# Patient Record
Sex: Male | Born: 1955 | Race: White | Hispanic: No | Marital: Married | Smoking: Never smoker
Health system: Southern US, Community
[De-identification: ages and names within clinical notes are randomized; demographics above are authoritative.]

## PROBLEM LIST (undated history)

## (undated) DIAGNOSIS — G4733 Obstructive sleep apnea (adult) (pediatric): Principal | ICD-10-CM

## (undated) DIAGNOSIS — Z9989 Dependence on other enabling machines and devices: Principal | ICD-10-CM

## (undated) DIAGNOSIS — E785 Hyperlipidemia, unspecified: Secondary | ICD-10-CM

## (undated) DIAGNOSIS — J309 Allergic rhinitis, unspecified: Secondary | ICD-10-CM

## (undated) DIAGNOSIS — M1990 Unspecified osteoarthritis, unspecified site: Secondary | ICD-10-CM

## (undated) DIAGNOSIS — E7800 Pure hypercholesterolemia, unspecified: Secondary | ICD-10-CM

## (undated) DIAGNOSIS — I341 Nonrheumatic mitral (valve) prolapse: Secondary | ICD-10-CM

## (undated) DIAGNOSIS — G4700 Insomnia, unspecified: Principal | ICD-10-CM

## (undated) HISTORY — DX: Hyperlipidemia, unspecified: E78.5

## (undated) HISTORY — DX: Pure hypercholesterolemia, unspecified: E78.00

## (undated) HISTORY — DX: Insomnia, unspecified: G47.00

## (undated) HISTORY — DX: Obstructive sleep apnea (adult) (pediatric): G47.33

## (undated) HISTORY — DX: Unspecified osteoarthritis, unspecified site: M19.90

## (undated) HISTORY — PX: OTHER SURGICAL HISTORY: SHX169

## (undated) HISTORY — DX: Allergic rhinitis, unspecified: J30.9

## (undated) HISTORY — DX: Nonrheumatic mitral (valve) prolapse: I34.1

## (undated) HISTORY — DX: Dependence on other enabling machines and devices: Z99.89

---

## 1983-03-24 HISTORY — PX: TONSILLECTOMY AND ADENOIDECTOMY: SUR1326

## 2013-02-28 ENCOUNTER — Ambulatory Visit (INDEPENDENT_AMBULATORY_CARE_PROVIDER_SITE_OTHER): Payer: BC Managed Care – PPO | Admitting: Family Medicine

## 2013-02-28 ENCOUNTER — Encounter: Payer: Self-pay | Admitting: Family Medicine

## 2013-02-28 VITALS — BP 110/68 | HR 73 | Ht 73.0 in | Wt 203.0 lb

## 2013-02-28 DIAGNOSIS — M9981 Other biomechanical lesions of cervical region: Secondary | ICD-10-CM

## 2013-02-28 DIAGNOSIS — M999 Biomechanical lesion, unspecified: Secondary | ICD-10-CM

## 2013-02-28 DIAGNOSIS — M542 Cervicalgia: Secondary | ICD-10-CM

## 2013-02-28 HISTORY — DX: Biomechanical lesion, unspecified: M99.9

## 2013-02-28 HISTORY — DX: Cervicalgia: M54.2

## 2013-02-28 NOTE — Assessment & Plan Note (Signed)
Patient responded very well to osteopathic manipulation today. Patient given home exercises to work on posture as well as discussed changes in seated position. Patient did notice improvement immediately after manipulation. Patient can continue the ibuprofen on an as-needed basis. I to see patient again in 2 weeks for further manipulation.

## 2013-02-28 NOTE — Assessment & Plan Note (Signed)
Decision today to treat with OMT was based on Physical Exam  After verbal consent patient was treated with HVLA, ME techniques in cervical, thoracic, lumbar and sacral areas  Patient tolerated the procedure well with improvement in symptoms  Patient given exercises, stretches and lifestyle modifications  See medications in patient instructions if given  Patient will follow up in 2 weeks                                      

## 2013-02-28 NOTE — Patient Instructions (Signed)
Very nice to meet you Do posture exercise on wall for 5 minutes a day Back exercises 3set of 10  Most days of the week.  Computer screen should be inline with eyesight. Drop seat or raise monitor.  Continue vitamin D, fish oil 3 grams daily, tumeric 500mg  twcie daily.  Come back in 2 weeks.

## 2013-02-28 NOTE — Progress Notes (Signed)
  I'm seeing this patient by the request  of:  Dr. Alessandra Bevels  CC: Neck pain  HPI: Patient is very pleasant 57 year old gentleman coming in with left-sided neck pain. Patient states he has had this pain intermittently over the course of the last 2 years. Patient is a member injury the initially while playing tennis. Patient states he had a exacerbation at that time that took narcotics in one week to heal. Patient states at this time he has had intermittent pain on the side that seems to be localized. Patient denies any radiation, numbness, or weakness in the upper [place]. Patient has taken ibuprofen on a very regular basis not just to try to avoid the pain. Patient states he notices he has trouble with rotating to the left secondary to the pain. Patient rates the pain a 5/10.  Past medical, surgical, family and social history reviewed. Medications reviewed all in the electronic medical record.   Review of Systems: No headache, visual changes, nausea, vomiting, diarrhea, constipation, dizziness, abdominal pain, skin rash, fevers, chills, night sweats, weight loss, swollen lymph nodes, body aches, joint swelling, muscle aches, chest pain, shortness of breath, mood changes.   Objective:    Blood pressure 110/68, pulse 73, height 6\' 1"  (1.854 m), weight 203 lb (92.08 kg), SpO2 97.00%.   General: No apparent distress alert and oriented x3 mood and affect normal, dressed appropriately.  HEENT: Pupils equal, extraocular movements intact Respiratory: Patient's speak in full sentences and does not appear short of breath Cardiovascular: No lower extremity edema, non tender, no erythema Skin: Warm dry intact with no signs of infection or rash on extremities or on axial skeleton. Abdomen: Soft nontender Neuro: Cranial nerves II through XII are intact, neurovascularly intact in all extremities with 2+ DTRs and 2+ pulses. Lymph: No lymphadenopathy of posterior or anterior cervical chain or axillae bilaterally.   Gait normal with good balance and coordination.  MSK: Non tender with full range of motion and good stability and symmetric strength and tone of shoulders, elbows, wrist, hip, knee and ankles bilaterally.  Neck: Inspection unremarkable. No palpable stepoffs. Negative Spurling's maneuver. Full neck range of motion Grip strength and sensation normal in bilateral hands Strength good C4 to T1 distribution No sensory change to C4 to T1 Negative Hoffman sign bilaterally Reflexes normal  OMT Physical Exam  Standing structural       Occiput left high  Shoulder left elevated  Inferior angle of scapula neutral  Illiac crest neutral  Medial malleolus  Standing flexion  Right Seated Flexion Right Cervical  C2 flexed rotated and side bent left C4 to flexed rotated inside that right  Thoracic T4 extended rotated and side bent right T7 extended rotated inside that left  Lumbar L2 flexed rotated inside that right  Sacrum Left on left  Illium Neutral    Impression and Recommendations:     This case required medical decision making of moderate complexity.

## 2013-02-28 NOTE — Progress Notes (Signed)
Pre-visit discussion using our clinic review tool. No additional management support is needed unless otherwise documented below in the visit note.  

## 2013-03-14 ENCOUNTER — Encounter: Payer: Self-pay | Admitting: Family Medicine

## 2013-03-14 ENCOUNTER — Ambulatory Visit (INDEPENDENT_AMBULATORY_CARE_PROVIDER_SITE_OTHER): Payer: BC Managed Care – PPO | Admitting: Family Medicine

## 2013-03-14 VITALS — BP 118/74 | HR 71

## 2013-03-14 DIAGNOSIS — M542 Cervicalgia: Secondary | ICD-10-CM

## 2013-03-14 DIAGNOSIS — M9981 Other biomechanical lesions of cervical region: Secondary | ICD-10-CM

## 2013-03-14 DIAGNOSIS — M999 Biomechanical lesion, unspecified: Secondary | ICD-10-CM

## 2013-03-14 NOTE — Progress Notes (Signed)
  CC: Followup neck pain  HPI: Patient is a 57 year old gentleman who was seen previously for neck pain. Patient did respond well to osteopathic manipulation. Patient was given home exercise program including working on posture. Patient states he is doing extremely better. He is approximately 80% better. Patient states that he is having no trouble with the exercises. Denies any new symptoms.  Past medical, surgical, family and social history reviewed. Medications reviewed all in the electronic medical record.   Review of Systems: No headache, visual changes, nausea, vomiting, diarrhea, constipation, dizziness, abdominal pain, skin rash, fevers, chills, night sweats, weight loss, swollen lymph nodes, body aches, joint swelling, muscle aches, chest pain, shortness of breath, mood changes.   Objective:    Blood pressure 118/74, pulse 71, SpO2 98.00%.   General: No apparent distress alert and oriented x3 mood and affect normal, dressed appropriately.  HEENT: Pupils equal, extraocular movements intact Respiratory: Patient's speak in full sentences and does not appear short of breath Cardiovascular: No lower extremity edema, non tender, no erythema Skin: Warm dry intact with no signs of infection or rash on extremities or on axial skeleton. Abdomen: Soft nontender Neuro: Cranial nerves II through XII are intact, neurovascularly intact in all extremities with 2+ DTRs and 2+ pulses. Lymph: No lymphadenopathy of posterior or anterior cervical chain or axillae bilaterally.  Gait normal with good balance and coordination.  MSK: Non tender with full range of motion and good stability and symmetric strength and tone of shoulders, elbows, wrist, hip, knee and ankles bilaterally.  Neck: Inspection unremarkable. No palpable stepoffs. Negative Spurling's maneuver. Full neck range of motion Grip strength and sensation normal in bilateral hands Strength good C4 to T1 distribution No sensory change to C4 to  T1 Negative Hoffman sign bilaterally Reflexes normal  OMT Physical Exam  Standing structural       Occiput left high   Standing flexion  Right Seated Flexion Right Cervical  C2 flexed rotated and side bent left C4 to flexed rotated inside that right  Thoracic T7 extended rotated inside that left  Lumbar L2 flexed rotated inside that right  Sacrum Left on left  Illium Neutral    Impression and Recommendations:     This case required medical decision making of moderate complexity.

## 2013-03-14 NOTE — Assessment & Plan Note (Signed)
Physical exam it does appear the patient is making improvement. Encourage him to continue home exercises. Discussed other over-the-counter medications that can be beneficial. We'll see patient to make it 3-4 weeks for another manipulation.

## 2013-03-14 NOTE — Patient Instructions (Signed)
Happy holidays! Good to see you.  Continue the exercises. You are making improvement.  Come back again in 3-4 weeks.

## 2013-03-14 NOTE — Progress Notes (Signed)
Pre-visit discussion using our clinic review tool. No additional management support is needed unless otherwise documented below in the visit note.  

## 2013-03-14 NOTE — Assessment & Plan Note (Signed)
Decision today to treat with OMT was based on Physical Exam  After verbal consent patient was treated with HVLA, ME techniques in cervical, thoracic, lumbar and sacral areas  Patient tolerated the procedure well with improvement in symptoms  Patient given exercises, stretches and lifestyle modifications  See medications in patient instructions if given  Patient will follow up in 3-4 weeks  

## 2013-04-17 ENCOUNTER — Encounter: Payer: Self-pay | Admitting: Family Medicine

## 2013-04-17 ENCOUNTER — Ambulatory Visit (INDEPENDENT_AMBULATORY_CARE_PROVIDER_SITE_OTHER): Payer: BC Managed Care – PPO | Admitting: Family Medicine

## 2013-04-17 VITALS — BP 120/58 | HR 67 | Temp 98.8°F | Resp 16 | Wt 204.0 lb

## 2013-04-17 DIAGNOSIS — M542 Cervicalgia: Secondary | ICD-10-CM

## 2013-04-17 DIAGNOSIS — M999 Biomechanical lesion, unspecified: Secondary | ICD-10-CM

## 2013-04-17 DIAGNOSIS — M9981 Other biomechanical lesions of cervical region: Secondary | ICD-10-CM

## 2013-04-17 NOTE — Progress Notes (Signed)
  CC: Followup neck pain  HPI: Patient is a 58 year old gentleman who was seen previously for neck pain. Patient did respond well to osteopathic manipulation. Patient continues the exercises 3 times a week without any difficulties. Patient states he is approximately 95% better at this time. Denies any new symptoms such as numbness or weakness in the upper extremities. Patient has also been able to do more exercises on a regular basis which has been beneficial.  Past medical, surgical, family and social history reviewed. Medications reviewed all in the electronic medical record.   Review of Systems: No headache, visual changes, nausea, vomiting, diarrhea, constipation, dizziness, abdominal pain, skin rash, fevers, chills, night sweats, weight loss, swollen lymph nodes, body aches, joint swelling, muscle aches, chest pain, shortness of breath, mood changes.   Objective:    Blood pressure 120/58, pulse 67, temperature 98.8 F (37.1 C), temperature source Oral, resp. rate 16, weight 204 lb (92.534 kg), SpO2 97.00%.   General: No apparent distress alert and oriented x3 mood and affect normal, dressed appropriately.  HEENT: Pupils equal, extraocular movements intact Respiratory: Patient's speak in full sentences and does not appear short of breath Cardiovascular: No lower extremity edema, non tender, no erythema Skin: Warm dry intact with no signs of infection or rash on extremities or on axial skeleton. Abdomen: Soft nontender Neuro: Cranial nerves II through XII are intact, neurovascularly intact in all extremities with 2+ DTRs and 2+ pulses. Lymph: No lymphadenopathy of posterior or anterior cervical chain or axillae bilaterally.  Gait normal with good balance and coordination.  MSK: Non tender with full range of motion and good stability and symmetric strength and tone of shoulders, elbows, wrist, hip, knee and ankles bilaterally.  Neck: Inspection unremarkable. No palpable stepoffs. Negative  Spurling's maneuver. Full neck range of motion Grip strength and sensation normal in bilateral hands Strength good C4 to T1 distribution No sensory change to C4 to T1 Negative Hoffman sign bilaterally Reflexes normal  OMT Physical Exam  Standing structural  Cervical  C2 flexed rotated and side bent left C4  flexed rotated inside that right  Thoracic T3 extended rotated and side bent right T7 extended rotated inside that left  Lumbar L2 flexed rotated inside that right  Sacrum Left on left  Illium Neutral    Impression and Recommendations:     This case required medical decision making of moderate complexity.

## 2013-04-17 NOTE — Progress Notes (Signed)
Pre-visit discussion using our clinic review tool. No additional management support is needed unless otherwise documented below in the visit note.  

## 2013-04-17 NOTE — Assessment & Plan Note (Signed)
Decision today to treat with OMT was based on Physical Exam  After verbal consent patient was treated with HVLA, ME techniques in cervical, thoracic, lumbar and sacral areas  Patient tolerated the procedure well with improvement in symptoms  Patient given exercises, stretches and lifestyle modifications  See medications in patient instructions if given  Patient will follow up in 5-6 weeks       

## 2013-04-17 NOTE — Patient Instructions (Signed)
Good ot see you Continue what you are doing Focus stretching the hip flexors and strengthening the hip abductors See you in 5 weeks!

## 2013-04-17 NOTE — Assessment & Plan Note (Signed)
Patient continues to do well with osteopathic manipulation. Patient given further strengthening exercises today that could be beneficial. Discussed over-the-counter medications that can be beneficial and patient will now try 5-6 weeks for followup.

## 2013-05-22 ENCOUNTER — Ambulatory Visit (INDEPENDENT_AMBULATORY_CARE_PROVIDER_SITE_OTHER): Payer: BC Managed Care – PPO | Admitting: Family Medicine

## 2013-05-22 ENCOUNTER — Encounter: Payer: Self-pay | Admitting: Family Medicine

## 2013-05-22 VITALS — BP 116/62 | HR 83 | Temp 97.0°F | Resp 16 | Wt 202.1 lb

## 2013-05-22 DIAGNOSIS — M999 Biomechanical lesion, unspecified: Secondary | ICD-10-CM

## 2013-05-22 DIAGNOSIS — M9981 Other biomechanical lesions of cervical region: Secondary | ICD-10-CM

## 2013-05-22 DIAGNOSIS — M542 Cervicalgia: Secondary | ICD-10-CM

## 2013-05-22 NOTE — Assessment & Plan Note (Signed)
Decision today to treat with OMT was based on Physical Exam  After verbal consent patient was treated with HVLA, ME techniques in cervical, thoracic, lumbar and sacral areas  Patient tolerated the procedure well with improvement in symptoms  Patient given exercises, stretches and lifestyle modifications  See medications in patient instructions if given  Patient will follow up in 5-6 weeks       

## 2013-05-22 NOTE — Patient Instructions (Signed)
Good to see you come back again in 5-6 weeks.  Keep doing what  You are doing Try the exercises I show when sitting around.

## 2013-05-22 NOTE — Assessment & Plan Note (Addendum)
Given new exercises  Discuss icing, discussed proper position on plane.  Back in 5 weeks.

## 2013-05-22 NOTE — Progress Notes (Signed)
CC: Followup neck pain  HPI: Patient is a 58 year old gentleman who was seen previously for neck pain. Patient did respond well to osteopathic manipulation. Patient has been traveling recently and has had long airplane flights which is cholecystectomy have some more discomfort. It seems to still be on the left side. Denies any radiation down the arms or any tingling. Patient has a difficult to actually rotate his head. Mild pain.   Past medical, surgical, family and social history reviewed. Medications reviewed all in the electronic medical record.   Review of Systems: No headache, visual changes, nausea, vomiting, diarrhea, constipation, dizziness, abdominal pain, skin rash, fevers, chills, night sweats, weight loss, swollen lymph nodes, body aches, joint swelling, muscle aches, chest pain, shortness of breath, mood changes.   Objective:    Blood pressure 116/62, pulse 83, temperature 97 F (36.1 C), temperature source Oral, resp. rate 16, weight 202 lb 1.9 oz (91.681 kg), SpO2 98.00%.   General: No apparent distress alert and oriented x3 mood and affect normal, dressed appropriately.  HEENT: Pupils equal, extraocular movements intact Respiratory: Patient's speak in full sentences and does not appear short of breath Cardiovascular: No lower extremity edema, non tender, no erythema Skin: Warm dry intact with no signs of infection or rash on extremities or on axial skeleton. Abdomen: Soft nontender Neuro: Cranial nerves II through XII are intact, neurovascularly intact in all extremities with 2+ DTRs and 2+ pulses. Lymph: No lymphadenopathy of posterior or anterior cervical chain or axillae bilaterally.  Gait normal with good balance and coordination.  MSK: Non tender with full range of motion and good stability and symmetric strength and tone of shoulders, elbows, wrist, hip, knee and ankles bilaterally.  Neck: Inspection unremarkable. No palpable stepoffs. Negative Spurling's  maneuver. Full neck range of motion Grip strength and sensation normal in bilateral hands Strength good C4 to T1 distribution No sensory change to C4 to T1 Negative Hoffman sign bilaterally Reflexes normal  OMT Physical Exam  Standing structural  Cervical  C2 flexed rotated and side bent left C4  flexed rotated inside that right  Thoracic T3 extended rotated and side bent right T5 extended rotated inside that left  Lumbar L2 flexed rotated inside that right  Sacrum Left on left  Illium Neutral    Impression and Recommendations:     This case required medical decision making of moderate complexity.

## 2013-05-22 NOTE — Progress Notes (Signed)
Pre visit review using our clinic review tool, if applicable. No additional management support is needed unless otherwise documented below in the visit note. 

## 2013-06-26 ENCOUNTER — Ambulatory Visit: Payer: BC Managed Care – PPO | Admitting: Family Medicine

## 2013-09-18 ENCOUNTER — Other Ambulatory Visit: Payer: Self-pay | Admitting: Orthopedic Surgery

## 2013-09-18 DIAGNOSIS — M19039 Primary osteoarthritis, unspecified wrist: Secondary | ICD-10-CM

## 2013-09-25 ENCOUNTER — Ambulatory Visit
Admission: RE | Admit: 2013-09-25 | Discharge: 2013-09-25 | Disposition: A | Discharge status: Home / Self Care | Payer: BC Managed Care – PPO | Source: Ambulatory Visit | Attending: Orthopedic Surgery | Admitting: Orthopedic Surgery

## 2013-09-25 DIAGNOSIS — M19039 Primary osteoarthritis, unspecified wrist: Secondary | ICD-10-CM

## 2014-01-25 ENCOUNTER — Encounter: Payer: Self-pay | Admitting: Neurology

## 2014-01-26 ENCOUNTER — Encounter: Payer: Self-pay | Admitting: Neurology

## 2014-01-26 ENCOUNTER — Ambulatory Visit (INDEPENDENT_AMBULATORY_CARE_PROVIDER_SITE_OTHER): Payer: BC Managed Care – PPO | Admitting: Neurology

## 2014-01-26 VITALS — BP 126/79

## 2014-01-26 DIAGNOSIS — G4700 Insomnia, unspecified: Secondary | ICD-10-CM

## 2014-01-26 HISTORY — DX: Insomnia, unspecified: G47.00

## 2014-01-26 MED ORDER — SUVOREXANT 15 MG PO TABS: 15.0000 mg | ORAL_TABLET | Freq: Every evening | ORAL | Status: DC

## 2014-01-26 NOTE — Patient Instructions (Signed)
Insomnia Insomnia is frequent trouble falling and/or staying asleep. Insomnia can be a long term problem or a short term problem. Both are common. Insomnia can be a short term problem when the wakefulness is related to a certain stress or worry. Long term insomnia is often related to ongoing stress during waking hours and/or poor sleeping habits. Overtime, sleep deprivation itself can make the problem worse. Every little thing feels more severe because you are overtired and your ability to cope is decreased. CAUSES   Stress, anxiety, and depression.  Poor sleeping habits.  Distractions such as TV in the bedroom.  Naps close to bedtime.  Engaging in emotionally charged conversations before bed.  Technical reading before sleep.  Alcohol and other sedatives. They may make the problem worse. They can hurt normal sleep patterns and normal dream activity.  Stimulants such as caffeine for several hours prior to bedtime.  Pain syndromes and shortness of breath can cause insomnia.  Exercise late at night.  Changing time zones may cause sleeping problems (jet lag). It is sometimes helpful to have someone observe your sleeping patterns. They should look for periods of not breathing during the night (sleep apnea). They should also look to see how long those periods last. If you live alone or observers are uncertain, you can also be observed at a sleep clinic where your sleep patterns will be professionally monitored. Sleep apnea requires a checkup and treatment. Give your caregivers your medical history. Give your caregivers observations your family has made about your sleep.  SYMPTOMS   Not feeling rested in the morning.  Anxiety and restlessness at bedtime.  Difficulty falling and staying asleep. TREATMENT   Your caregiver may prescribe treatment for an underlying medical disorders. Your caregiver can give advice or help if you are using alcohol or other drugs for self-medication. Treatment  of underlying problems will usually eliminate insomnia problems.  Medications can be prescribed for short time use. They are generally not recommended for lengthy use.  Over-the-counter sleep medicines are not recommended for lengthy use. They can be habit forming.  You can promote easier sleeping by making lifestyle changes such as:  Using relaxation techniques that help with breathing and reduce muscle tension.  Exercising earlier in the day.  Changing your diet and the time of your last meal. No night time snacks.  Establish a regular time to go to bed.  Counseling can help with stressful problems and worry.  Soothing music and white noise may be helpful if there are background noises you cannot remove.  Stop tedious detailed work at least one hour before bedtime. HOME CARE INSTRUCTIONS   Keep a diary. Inform your caregiver about your progress. This includes any medication side effects. See your caregiver regularly. Take note of:  Times when you are asleep.  Times when you are awake during the night.  The quality of your sleep.  How you feel the next day. This information will help your caregiver care for you.  Get out of bed if you are still awake after 15 minutes. Read or do some quiet activity. Keep the lights down. Wait until you feel sleepy and go back to bed.  Keep regular sleeping and waking hours. Avoid naps.  Exercise regularly.  Avoid distractions at bedtime. Distractions include watching television or engaging in any intense or detailed activity like attempting to balance the household checkbook.  Develop a bedtime ritual. Keep a familiar routine of bathing, brushing your teeth, climbing into bed at the same   time each night, listening to soothing music. Routines increase the success of falling to sleep faster.  Use relaxation techniques. This can be using breathing and muscle tension release routines. It can also include visualizing peaceful scenes. You can  also help control troubling or intruding thoughts by keeping your mind occupied with boring or repetitive thoughts like the old concept of counting sheep. You can make it more creative like imagining planting one beautiful flower after another in your backyard garden.  During your day, work to eliminate stress. When this is not possible use some of the previous suggestions to help reduce the anxiety that accompanies stressful situations. MAKE SURE YOU:   Understand these instructions.  Will watch your condition.  Will get help right away if you are not doing well or get worse. Document Released: 03/06/2000 Document Revised: 06/01/2011 Document Reviewed: 04/06/2007 [employer] Patient Information 2015 [employer]. This information is not intended to replace advice given to you by your health care provider. Make sure you discuss any questions you have with your health care provider.  

## 2014-01-26 NOTE — Progress Notes (Signed)
SLEEP MEDICINE CLINIC   Provider:  Melvyn Novasarmen  Judee Hennick, M D  Referring Provider: Noni Saupeedding, Jeremiah F. Dickson, Dickson Primary Care Physician:  Jeremiah Dickson  Chief Complaint  Patient presents with  . NP Sleep    Rm 10, alone    HPI:  Jeremiah Dickson is a caucasian, right handed , married  58 y.o. male , seen here as a referral  from Dr. Jeanie Dickson for a sleep consultation.  Jeremiah Dickson reports that he for the last 6 months has noted progressive insomnia by now considered chronic. He has been a life long light sleeper, and easy to wake up, but usually felt oriented and refreshed in the morning. He has no difficulty falling asleep, but staying asleep.   He goes to bed around 10 PM and promptly sleeps,  No TV and no reading, bedroom  Is cool, quiet and dark.  He has no curtains , big window front.  Likes the morning light waking him.  Lives remote, no traffic sounds or lights.  He wakes up at 2  ( unsure why ) and from then on tosses and turns and has trouble to sleep again. He doesn' t have to go to the bathroom, he doesn't snore , he sometimes gasps, not clear if this is apnea. His wife a is a deep and sound sleeper. He sets an alarm, but always wakes up before the alarm set off.  He travels a lot for business. Not many time zone changes,  He has some aches and pain, arthritis of neck and shoulders, tennis related injuries, but this is not new and not sleep disruptive.  He drinks only 3-4 glasses of red wine weekly, at the most.  He used melatonin and GABA .  He used vitamins , got off coffee- but still caffeine, green tea only in winter.   Dr. Jeanie Dickson prescribed  5 mg Ambien, which did  work .  He sleeps better in a HOTEL than at home! He feels hypervigilant at home.  He may be a good Belsomra candidate.            Review of Systems:    Out of a complete 14 system review, the patient complains of only the following symptoms, and all other reviewed systems are negative.   Epworth  score  13 , Fatigue severity score 30  , depression score 1   History   Social History  . Marital Status: Married    Spouse Name: N/A    Number of Children: N/A  . Years of Education: N/A   Occupational History  . Not on file.   Social History Main Topics  . Smoking status: Never Smoker   . Smokeless tobacco: Never Used  . Alcohol Use: 0.0 oz/week    0 Not specified per week     Comment: 3-4 wine glasses a week  . Drug Use: No  . Sexual Activity: Not on file   Other Topics Concern  . Not on file   Social History Narrative   Right handed. Married, 3 kids, College grad, Caffeine 2 cups daily    Family History  Problem Relation Age of Onset  . Arthritis Mother   . Cancer Mother   . Arthritis Father   . Cancer Father   . Cancer Maternal Grandmother   . Cancer Paternal Grandmother   . Cancer Maternal Grandfather   . Heart disease Paternal Grandfather     Past Medical History  Diagnosis Date  . Arthritis   .  Hyperlipidemia   . Arthritis   . MVP (mitral valve prolapse)     H/O  . Allergic rhinitis   . Hypercholesteremia   . Insomnia, persistent 01/26/2014    Past Surgical History  Procedure Laterality Date  . Tonsillectomy and adenoidectomy  1985    Current Outpatient Prescriptions  Medication Sig Dispense Refill  . cholecalciferol (VITAMIN D) 1000 UNITS tablet Take 2,000 Units by mouth daily.    Marland Kitchen ibuprofen (ADVIL,MOTRIN) 400 MG tablet Take 400 mg by mouth every 6 (six) hours as needed (Takes when plays tennis (3 x week)).    . Multiple Vitamins-Minerals (MULTIVITAMIN WITH MINERALS) tablet Take 1 tablet by mouth daily.    . Omega-3 Fatty Acids (FISH OIL) 1200 MG CAPS Take 2 capsules by mouth daily.     . sildenafil (VIAGRA) 100 MG tablet Take 100 mg by mouth daily as needed for erectile dysfunction (1/2 - 1 tablet daily prn).    . zolpidem (AMBIEN) 5 MG tablet Take 5 mg by mouth at bedtime as needed for sleep (1-2 tabs hs prn).     No current  facility-administered medications for this visit.    Allergies as of 01/26/2014 - Review Complete 01/26/2014  Allergen Reaction Noted  . Ace inhibitors Cough 01/25/2014  . Lipitor [atorvastatin]  01/25/2014  . Pravastatin sodium  01/25/2014  . Welchol [colesevelam hcl]  01/25/2014    Vitals: BP 126/79 mmHg Last Weight:  Wt Readings from Last 1 Encounters:  05/22/13 202 lb 1.9 oz (91.681 kg)       Last Height:   Ht Readings from Last 1 Encounters:  02/28/13 6\' 1"  (1.854 m)    Physical exam:  General: The patient is awake, alert and appears not in acute distress. The patient is well groomed. Head: Normocephalic, atraumatic. Neck is supple. Mallampati 3  neck circumference: 17.25  Nasal airflow unrestricted , TMJ is not evident . Retrognathia is not seen.  Cardiovascular:  Regular rate and rhythm, without  murmurs or carotid bruit, and without distended neck veins. Respiratory: Lungs are clear to auscultation. Skin:  Without evidence of edema, or rash Trunk: BMI is  elevated , normal posture.  Neurologic exam : The patient is awake and alert, oriented to place and time.   Memory subjective described as intact. There is a normal attention span & concentration ability. Speech is fluent without dysarthria, dysphonia or aphasia. Mood and affect are appropriate.  Cranial nerves: Pupils are equal and briskly reactive to light. Funduscopic exam without evidence of pallor or edema. Extraocular movements  in vertical and horizontal planes intact and without nystagmus. Visual fields by finger perimetry are intact. Hearing to finger rub intact.  Facial sensation intact to fine touch. Facial motor strength is symmetric and tongue and uvula move midline.  Motor exam:   Normal tone, muscle bulk and symmetric strength in all extremities.   vibration tested in all extremities.Proprioception  normal.  Coordination: Rapid alternating movements in the fingers/hands is normal.  Finger-to-nose  maneuver  normal without evidence of ataxia, dysmetria or tremor.  Gait and station: Patient walks without assistive device and is able unassisted to climb up to the exam table. Strength within normal limits. Stance is stable and normal.  Deep tendon reflexes: in the  upper and lower extremities are symmetric and intact. Babinski maneuver response is  Downgoing.     Assessment:  After physical and neurologic examination, review of laboratory studies, imaging, neurophysiology testing and pre-existing records, assessment is   1)  BCBS  Patient , I would like to screen for apnea.  i will do an ONO .  2) insomnia - fit bit - many restless spells,  BELSOMRA  15 mg started.    The patient was advised of the nature of the diagnosed sleep disorder , the treatment options and risks for general a health and wellness arising from not treating the condition. Visit duration was  30  minutes.   Plan:  Treatment plan and additional workup :  Belsomra Trial. ONO, depending on resulots, PSG split or not.      Porfirio Mylararmen Deneshia Zucker Dickson  01/26/2014

## 2014-01-29 ENCOUNTER — Encounter: Payer: Self-pay | Admitting: Neurology

## 2014-02-20 ENCOUNTER — Telehealth: Payer: Self-pay | Admitting: Neurology

## 2014-02-20 ENCOUNTER — Other Ambulatory Visit: Payer: Self-pay | Admitting: Neurology

## 2014-02-20 DIAGNOSIS — G4733 Obstructive sleep apnea (adult) (pediatric): Secondary | ICD-10-CM

## 2014-02-20 NOTE — Telephone Encounter (Signed)
Pt is calling because he has not received the results from his overnight pulse oximetry.  Please review so that patient can be contacted.

## 2014-02-20 NOTE — Telephone Encounter (Signed)
One hour and 4 minutes at or below 89% , 27 minutes at or below 88% This is documenting  hypoxemia and not a normal result.  Need to consider oxygen supplementation . Note CC to PCP and to WPS ResourcesDavid Plemmons.  CD

## 2014-02-21 NOTE — Telephone Encounter (Signed)
Patient has been made aware of his ONO results verbally understood and appointment scheduled 03/20/14 for split night study.

## 2014-03-20 ENCOUNTER — Ambulatory Visit (INDEPENDENT_AMBULATORY_CARE_PROVIDER_SITE_OTHER): Payer: BC Managed Care – PPO | Admitting: Neurology

## 2014-03-20 DIAGNOSIS — G4733 Obstructive sleep apnea (adult) (pediatric): Secondary | ICD-10-CM

## 2014-03-21 NOTE — Sleep Study (Signed)
Please see the scanned sleep study interpretation located in the Procedure tab within the Chart review section 

## 2014-03-22 ENCOUNTER — Telehealth: Payer: Self-pay | Admitting: Neurology

## 2014-03-22 DIAGNOSIS — G4733 Obstructive sleep apnea (adult) (pediatric): Secondary | ICD-10-CM

## 2014-03-22 NOTE — Telephone Encounter (Signed)
Left VM for patient, that his apnea can be treated by CPAP or by dental device, will place order for CPAP today 12-31 so it's in this calendar year and may safe him new co-pay /deductable. CD

## 2014-03-26 ENCOUNTER — Encounter: Payer: Self-pay | Admitting: Neurology

## 2014-03-26 NOTE — Telephone Encounter (Signed)
Patient was contacted and provided the results of his sleep study which did reveal obstructive sleep apnea.  Patient was informed that CPAP treatment was effective in treating his apnea and that referral to a local DME was advised.  Patient was in agreement and was referred to [facility] for CPAP set up.  The patient gave verbal permission to mail a copy of his test results.  Dr. Gwendlyn Deutscher was faxed a copy of the the results.

## 2014-03-27 ENCOUNTER — Encounter: Payer: Self-pay | Admitting: *Deleted

## 2014-04-30 ENCOUNTER — Encounter: Payer: Self-pay | Admitting: Neurology

## 2014-05-23 ENCOUNTER — Encounter: Payer: Self-pay | Admitting: Neurology

## 2014-05-23 ENCOUNTER — Ambulatory Visit (INDEPENDENT_AMBULATORY_CARE_PROVIDER_SITE_OTHER): Payer: BLUE CROSS/BLUE SHIELD | Admitting: Neurology

## 2014-05-23 VITALS — BP 118/78 | HR 66 | Resp 16 | Ht 73.0 in | Wt 206.8 lb

## 2014-05-23 DIAGNOSIS — G4733 Obstructive sleep apnea (adult) (pediatric): Secondary | ICD-10-CM

## 2014-05-23 DIAGNOSIS — Z9989 Dependence on other enabling machines and devices: Secondary | ICD-10-CM

## 2014-05-23 HISTORY — DX: Obstructive sleep apnea (adult) (pediatric): Z99.89

## 2014-05-23 HISTORY — DX: Obstructive sleep apnea (adult) (pediatric): G47.33

## 2014-05-23 NOTE — Progress Notes (Signed)
SLEEP MEDICINE CLINIC   Provider:  Melvyn Novas, M D  Referring Provider: Noni Saupe, MD Primary Care Physician:  Noni Saupe., MD  Chief Complaint  Patient presents with  . RV cpap    Rm 10, alone    HPI:  Jeremiah Dickson is a caucasian, right handed , married  59 y.o. male , seen here  Originally as a referral  from Dr. Jeanie Sewer for a sleep consultation. Now seen in re visit after SPLIT night polysomnography , dated 05-23-14   Interval history. Mr. Garofano had undergone an overnight pulse oximetry to screen for apnea it showed one hour and 4 minutes of oxygen levels at or below 89% saturation 27 minutes at or below 88%. This hypoxemia result letter to a referral for a split-night polysomnography. The split-night polysomnogram was performed on 12-20 9-15 almost the patient endorsed the Epworth score at 13 points at the time had a very long sleep latency obviously difficulties to fall asleep and stay asleep. Most evident was that his AHI was 20 and his RDI 21.6. The supine AHI was 26.8 but there was no hypoxemia noticed in the diagnostic part of this study. We then followed with the titration and the patient did well at 8 cm water pressure with an AHI of 2.8. This moderate degree of sleep apnea was not associated with hypoxemia not associated with REM sleep exacerbation or positionally influenced. I wrote that the patient may also respond to a dental device instead of the CPAP should he have trouble adjusting to it. I have a download available this patient received a dream station CPAP machine with a setting starting at 5 and increasing to a peak of 15 cm water pressure,  uses 90% of the time 9.4 cm water pressure  this patient. His average time in air leak is 5 minutes and 24 seconds at night average user time 7 hours and 41 minutes.  He used the device 83.3% of the time,  which is an satisfying compliance.   He just received a new mask that seems to fit him better it also  documents a lower AHI. After he received a better fitting mask and has less air leaks he does acknowledge that he feels better using CPAP is less fatigued and less daytime sleepy. He has no change in his nocturnal bathroom take frequency initially his residual AHI was rather high accompanied by a high air leak. It was 10.6 now over the last 10 days the AHI has been reduced to less than 5. Your longer has any insomnia complaint. He tried Belsomra with very little effect he has been doing well with 5 mg of Ambien when necessary but hasn't used it in over 3 weeks. He does not request any refills either. Today's Epworth Sleepiness Scale on CPAP is 3 points down from 13 prior to CPAP use and his fatigue severity score is 12 points.  [facility] changed his interface from a dream ware mask to  a nasal pillow by resmed, he has a dream station CPAP 5-15 cm water  and a heated tube. , no condensation water.    Last visit note. CD Mr. Whilden reports that he for the last 6 months has noted progressive insomnia by now considered chronic. He has been a life long light sleeper, and easy to wake up, but usually felt oriented and refreshed in the morning. He has no difficulty falling asleep, but staying asleep.  He goes to bed around 10 PM  and promptly sleeps,  No TV and no reading, bedroom  Is cool, quiet and dark.  He has no curtains , big window front.  Likes the morning light waking him.  Lives remote, no traffic sounds or lights.  He wakes up at 2  ( unsure why ) and from then on tosses and turns and has trouble to sleep again. He doesn' t have to go to the bathroom, he doesn't snore , he sometimes gasps, not clear if this is apnea. His wife a is a deep and sound sleeper. He sets an alarm, but always wakes up before the alarm set off.  He travels a lot for business. Not many time zone changes, He has some aches and pain, arthritis of neck and shoulders, tennis related injuries, but this is not new and not sleep  disruptive.  He drinks only 3-4 glasses of red wine weekly, at the most.  He used melatonin and GABA .  He used vitamins , got off coffee- but still caffeine, green tea only in winter.  Dr. Jeanie Sewer prescribed  5 mg Ambien, which did work well  .  He sleeps better in a HOTEL than at home! He feels hypervigilant at home.  He may be a good Belsomra candidate.   CD , Dec 2015            Review of Systems:    Out of a complete 14 system review, the patient complains of only the following symptoms, and all other reviewed systems are negative.   Epworth score  13 , Fatigue severity score 30  , depression score 1   History   Social History  . Marital Status: Married    Spouse Name: N/A  . Number of Children: N/A  . Years of Education: N/A   Occupational History  . Not on file.   Social History Main Topics  . Smoking status: Never Smoker   . Smokeless tobacco: Never Used  . Alcohol Use: 0.0 oz/week    0 Standard drinks or equivalent per week     Comment: 3-4 wine glasses a week  . Drug Use: No  . Sexual Activity: Not on file   Other Topics Concern  . Not on file   Social History Narrative   Right handed. Married, 3 kids, College grad, Caffeine 2 cups daily    Family History  Problem Relation Age of Onset  . Arthritis Mother   . Cancer Mother   . Arthritis Father   . Cancer Father   . Cancer Maternal Grandmother   . Cancer Paternal Grandmother   . Cancer Maternal Grandfather   . Heart disease Paternal Grandfather     Past Medical History  Diagnosis Date  . Arthritis   . Hyperlipidemia   . Arthritis   . MVP (mitral valve prolapse)     H/O  . Allergic rhinitis   . Hypercholesteremia   . Insomnia, persistent 01/26/2014  . OSA on CPAP 05/23/2014    Past Surgical History  Procedure Laterality Date  . Tonsillectomy and adenoidectomy  1985    Current Outpatient Prescriptions  Medication Sig Dispense Refill  . cholecalciferol (VITAMIN D) 1000 UNITS  tablet Take 2,000 Units by mouth daily.    Marland Kitchen ibuprofen (ADVIL,MOTRIN) 400 MG tablet Take 400 mg by mouth every 6 (six) hours as needed (Takes when plays tennis (3 x week)).    Marland Kitchen LIVALO 2 MG TABS Take 1 tablet by mouth daily.  4  . Multiple Vitamins-Minerals (  MULTIVITAMIN WITH MINERALS) tablet Take 1 tablet by mouth daily.    . Omega-3 Fatty Acids (FISH OIL) 1200 MG CAPS Take 2 capsules by mouth daily.     . sildenafil (VIAGRA) 100 MG tablet Take 100 mg by mouth daily as needed for erectile dysfunction (1/2 - 1 tablet daily prn).    . Suvorexant (BELSOMRA) 15 MG TABS Take 15 mg by mouth Nightly. (Patient not taking: Reported on 05/23/2014) 10 tablet 1   No current facility-administered medications for this visit.    Allergies as of 05/23/2014 - Review Complete 05/23/2014  Allergen Reaction Noted  . Ace inhibitors Cough 01/25/2014  . Lipitor [atorvastatin]  01/25/2014  . Pravastatin sodium  01/25/2014  . Welchol [colesevelam hcl]  01/25/2014    Vitals: BP 118/78 mmHg  Pulse 66  Resp 16  Ht 6\' 1"  (1.854 m)  Wt 206 lb 12.8 oz (93.804 kg)  BMI 27.29 kg/m2 Last Weight:  Wt Readings from Last 1 Encounters:  05/23/14 206 lb 12.8 oz (93.804 kg)       Last Height:   Ht Readings from Last 1 Encounters:  05/23/14 6\' 1"  (1.854 m)    Physical exam:  General: The patient is awake, alert and appears not in acute distress. The patient is well groomed. Head: Normocephalic, atraumatic. Neck is supple. Mallampati 3  neck circumference: 17.25  Nasal airflow unrestricted , TMJ is not evident . Retrognathia is not seen.  Cardiovascular:  Regular rate and rhythm, without  murmurs or carotid bruit, and without distended neck veins. Respiratory: Lungs are clear to auscultation. Skin:  Without evidence of edema, or rash Trunk: BMI is  elevated , normal posture.  Neurologic exam : The patient is awake and alert, oriented to place and time.   Memory subjective described as intact. There is a normal  attention span & concentration ability. Speech is fluent without dysarthria, dysphonia or aphasia. Mood and affect are appropriate.  Cranial nerves: Pupils are equal and briskly reactive to light. Funduscopic exam without evidence of pallor or edema. Extraocular movements  in vertical and horizontal planes intact and without nystagmus. Visual fields by finger perimetry are intact. Hearing to finger rub intact.  Facial sensation intact to fine touch. Facial motor strength is symmetric and tongue and uvula move midline.  Motor exam:   Normal tone, muscle bulk and symmetric strength in all extremities.   vibration tested in all extremities.Proprioception  normal.  Coordination: Rapid alternating movements in the fingers/hands is normal.  Finger-to-nose maneuver  normal without evidence of ataxia, dysmetria or tremor.  Gait and station: Patient walks without assistive device and is able unassisted to climb up to the exam table. Strength within normal limits. Stance is stable and normal.  Deep tendon reflexes: in the  upper and lower extremities are symmetric and intact. Babinski maneuver response is  Downgoing.     Assessment:  After physical and neurologic examination, review of laboratory studies, imaging, neurophysiology testing and pre-existing records, assessment is   1) BCBS  Patient , I would like to screen for apnea.  i will do an ONO .  2) insomnia - fit bit - many restless spells,  BELSOMRA  15 mg started.    The patient was advised of the nature of the diagnosed sleep disorder , the treatment options and risks for general a health and wellness arising from not treating the condition. Visit duration was  30  minutes.   Plan:  Treatment plan and additional workup :  Belsomra Trial. ONO, depending on resulots, PSG split or not.      Porfirio Mylararmen Shondale Quinley MD  05/23/2014

## 2014-05-25 ENCOUNTER — Encounter: Payer: Self-pay | Admitting: Neurology

## 2014-05-30 ENCOUNTER — Encounter: Payer: Self-pay | Admitting: Neurology

## 2014-06-04 ENCOUNTER — Telehealth: Payer: Self-pay

## 2014-06-04 NOTE — Telephone Encounter (Signed)
Provider Error

## 2014-11-14 ENCOUNTER — Ambulatory Visit (INDEPENDENT_AMBULATORY_CARE_PROVIDER_SITE_OTHER): Payer: BLUE CROSS/BLUE SHIELD | Admitting: Neurology

## 2014-11-14 ENCOUNTER — Encounter: Payer: Self-pay | Admitting: Neurology

## 2014-11-14 VITALS — BP 142/80 | HR 78 | Resp 20 | Ht 73.0 in | Wt 210.0 lb

## 2014-11-14 DIAGNOSIS — G4733 Obstructive sleep apnea (adult) (pediatric): Secondary | ICD-10-CM

## 2014-11-14 MED ORDER — ZOLPIDEM TARTRATE 10 MG PO TABS: 10.0000 mg | ORAL_TABLET | Freq: Every evening | ORAL | Status: DC | PRN

## 2014-11-14 NOTE — Patient Instructions (Signed)
Dental device for snoring and apnea,   Dx Apnea with insomnia.

## 2014-11-14 NOTE — Progress Notes (Signed)
SLEEP MEDICINE CLINIC   Provider:  Melvyn Novas, M D  Referring Provider: Noni Saupe, MD Primary Care Physician:  Noni Saupe., MD  Chief Complaint  Patient presents with  . Follow-up    can't sleep with cpap, rm 11, alone    HPI:  Jeremiah Dickson is a caucasian, right handed , married  59 y.o. male , seen here originally as a referral from Dr. Jeanie Sewer for a sleep consultation. Now seen in yearly revisit after SPLIT night polysomnography, 03-20-14, and CPAP follow up.  His baseline AHI was 20 and his RDI 21.6. He had no significant oxygen desaturations. He was titrated to 10 cm water pressure. I'm able today to ask is a download of his C-Flex. This shows that he is using an average CPAP pressure of about 7 cm water, and that his AHI varies greatly from 3.3-8.7 years only use a device 33.3% of the time is currently using an AutoSet between 5 and 15 cm water pressure window was 3 cm EPR and the humidifier is set at 5. Non compliance due to intolerance , Epworth 21/ 24 - dangerous .  He needs an alternative to CPAP . Ambien helped with nocturnal sleep. We meet to arrange a dental referral.      HISTORY.  Jeremiah Dickson had undergone an overnight pulse oximetry to screen for apnea it showed one hour and 4 minutes of oxygen levels at or below 89% saturation 27 minutes at or below 88%. This hypoxemia result letter to a referral for a split-night polysomnography. The split-night polysomnogram was performed on 12-20 9-15 almost the patient endorsed the Epworth score at 13 points at the time had a very long sleep latency obviously difficulties to fall asleep and stay asleep. Most evident was that his AHI was 20 and his RDI 21.6. The supine AHI was 26.8 but there was no hypoxemia noticed in the diagnostic part of this study. We then followed with the titration and the patient did well at 8 cm water pressure with an AHI of 2.8. This moderate degree of sleep apnea was not associated  with hypoxemia not associated with REM sleep exacerbation or positionally influenced. I wrote that the patient may also respond to a dental device instead of the CPAP should he have trouble adjusting to it. I have a download available this patient received a dream station CPAP machine with a setting starting at 5 and increasing to a peak of 15 cm water pressure,  uses 90% of the time 9.4 cm water pressure  this patient. His average time in air leak is 5 minutes and 24 seconds at night average user time 7 hours and 41 minutes.  He used the device 83.3% of the time,  which is an satisfying compliance. He just received a new mask that seems to fit him better it also documents a lower AHI. After he received a better fitting mask and has less air leaks he does acknowledge that he feels better using CPAP is less fatigued and less daytime sleepy. He has no change in his nocturnal bathroom take frequency initially his residual AHI was rather high accompanied by a high air leak. It was 10.6 now over the last 10 days the AHI has been reduced to less than 5. Your longer has any insomnia complaint. He tried Belsomra with very little effect-  he has been doing well with 5 mg of Ambien ( when necessary ) but hasn't used it in over  3 weeks.  He does not request any refills either.  Epworth Sleepiness Scale on CPAP is 3 points down from 13 prior to CPAP use and his fatigue severity score is 12 points.  [facility] changed his interface from a dream ware mask to  a nasal pillow by resmed, he has a dream station CPAP 5-15 cm water  and a heated tube. , no condensation water.    Sleep habits ;  Jeremiah Dickson reports that he for the last 6 months has noted progressive insomnia by now considered chronic. He has been a life long light sleeper, and easy to wake up, but usually felt oriented and refreshed in the morning. He has no difficulty falling asleep, but staying asleep.  He goes to bed around 10 PM and promptly sleeps,  No TV  and no reading, bedroom  Is cool, quiet and dark.  He has no curtains , big window front.  Likes the morning light waking him.  Lives remote, no traffic sounds or lights.  He wakes up at 2  ( unsure why ) and from then on tosses and turns and has trouble to sleep again. He doesn' t have to go to the bathroom, he doesn't snore , he sometimes gasps, not clear if this is apnea. His wife a is a deep and sound sleeper. He sets an alarm, but always wakes up before the alarm set off.  He travels a lot for business. Not many time zone changes, He has some aches and pain, arthritis of neck and shoulders, tennis related injuries, but this is not new and not sleep disruptive.  He drinks only 3-4 glasses of red wine weekly, at the most.  He used melatonin and GABA .  He used vitamins , got off coffee- but still caffeine, green tea only in winter.  Dr. Jeanie Sewer prescribed  5 mg Ambien, which did work well  .  He sleeps better in a HOTEL than at home! He feels hypervigilant at home.  He may be a good Belsomra candidate.   CD , Dec 2015            Review of Systems:    Out of a complete 14 system review, the patient complains of only the following symptoms, and all other reviewed systems are negative.   Epworth score   21 up from 13 , Fatigue severity score  52 from 30  , depression score 1- intolerant of CPAP.   Social History   Social History  . Marital Status: Married    Spouse Name: N/A  . Number of Children: N/A  . Years of Education: N/A   Occupational History  . Not on file.   Social History Main Topics  . Smoking status: Never Smoker   . Smokeless tobacco: Never Used  . Alcohol Use: 0.0 oz/week    0 Standard drinks or equivalent per week     Comment: 3-4 wine glasses a week  . Drug Use: No  . Sexual Activity: Not on file   Other Topics Concern  . Not on file   Social History Narrative   Right handed. Married, 3 kids, College grad, Caffeine 2 cups daily    Family History    Problem Relation Age of Onset  . Arthritis Mother   . Cancer Mother   . Arthritis Father   . Cancer Father   . Cancer Maternal Grandmother   . Cancer Paternal Grandmother   . Cancer Maternal Grandfather   . Heart  disease Paternal Grandfather     Past Medical History  Diagnosis Date  . Arthritis   . Hyperlipidemia   . Arthritis   . MVP (mitral valve prolapse)     H/O  . Allergic rhinitis   . Hypercholesteremia   . Insomnia, persistent 01/26/2014  . OSA on CPAP 05/23/2014    Past Surgical History  Procedure Laterality Date  . Tonsillectomy and adenoidectomy  1985    Current Outpatient Prescriptions  Medication Sig Dispense Refill  . cholecalciferol (VITAMIN D) 1000 UNITS tablet Take 2,000 Units by mouth daily.    Marland Kitchen ibuprofen (ADVIL,MOTRIN) 400 MG tablet Take 400 mg by mouth every 6 (six) hours as needed (Takes when plays tennis (3 x week)).    Marland Kitchen LIVALO 2 MG TABS Take 1 tablet by mouth daily.  4  . Multiple Vitamins-Minerals (MULTIVITAMIN WITH MINERALS) tablet Take 1 tablet by mouth daily.    . Omega-3 Fatty Acids (FISH OIL) 1200 MG CAPS Take 2 capsules by mouth daily.     . sildenafil (VIAGRA) 100 MG tablet Take 100 mg by mouth daily as needed for erectile dysfunction (1/2 - 1 tablet daily prn).    . Suvorexant (BELSOMRA) 15 MG TABS Take 15 mg by mouth Nightly. 10 tablet 1   No current facility-administered medications for this visit.    Allergies as of 11/14/2014 - Review Complete 11/14/2014  Allergen Reaction Noted  . Ace inhibitors Cough 01/25/2014  . Lipitor [atorvastatin]  01/25/2014  . Pravastatin sodium  01/25/2014  . Welchol [colesevelam hcl]  01/25/2014    Vitals: BP 142/80 mmHg  Pulse 78  Resp 20  Ht 6\' 1"  (1.854 m)  Wt 210 lb (95.255 kg)  BMI 27.71 kg/m2 Last Weight:  Wt Readings from Last 1 Encounters:  11/14/14 210 lb (95.255 kg)       Last Height:   Ht Readings from Last 1 Encounters:  11/14/14 6\' 1"  (1.854 m)    Physical exam:  General:  The patient is awake, alert and appears not in acute distress. The patient is well groomed. Head: Normocephalic, atraumatic. Neck is supple. Mallampati 3  neck circumference: 17.25  Nasal airflow unrestricted , TMJ is not evident . Retrognathia is not seen.  Cardiovascular:  Regular rate and rhythm, without  murmurs or carotid bruit, and without distended neck veins. Respiratory: Lungs are clear to auscultation. Skin:  Without evidence of edema, or rash Trunk: BMI is  elevated , normal posture.  Neurologic exam : The patient is awake and alert, oriented to place and time.   Memory subjective described as intact. There is a normal attention span & concentration ability. Speech is fluent without dysarthria, dysphonia or aphasia. Mood and affect are appropriate.  Cranial nerves: Pupils are equal and briskly reactive to light. Funduscopic exam without evidence of pallor or edema. Extraocular movements  in vertical and horizontal planes intact and without nystagmus. Visual fields by finger perimetry are intact. Hearing to finger rub intact.  Facial sensation intact to fine touch. Facial motor strength is symmetric and tongue and uvula move midline.  Motor exam:   Normal tone, muscle bulk and symmetric strength in all extremities.Vibration tested in all extremities.Proprioception  normal.  upper and lower extremities DTR are symmetric and intact. Babinski maneuver response is downgoing.  Assessment:  After physical and neurologic examination, review of laboratory studies, imaging, neurophysiology testing and pre-existing records, assessment is   1) OSA 2) insomnia - fit bit - many restless spells,  The patient was advised of the nature of the diagnosed sleep disorder , the treatment options and risks for general a health and wellness arising from not treating the condition. Visit duration was 20  minutes.   Plan:  Treatment plan and additional workup :  Ambien  5 mg, d/c CPAP and referral  to dentist , Dr. Irene Limbo, DDS     Melvyn Novas MD  11/14/2014

## 2014-12-10 ENCOUNTER — Telehealth: Payer: Self-pay | Admitting: Neurology

## 2014-12-10 NOTE — Telephone Encounter (Signed)
Patient is calling and stated no one has ever called him about his mouth piece.  Please call.

## 2014-12-10 NOTE — Telephone Encounter (Signed)
Pt states that Dr. Alveda Reasons office has not called him yet.  I called Dr. Alveda Reasons office who does not have any information on him yet. I advised them that I would fax over his information, and they said they would call pt today. I called pt back and advised him that I faxed over his information to Dr. Toni Arthurs and that they said they would call him today. Pt verbalized understanding.

## 2015-02-20 ENCOUNTER — Ambulatory Visit (INDEPENDENT_AMBULATORY_CARE_PROVIDER_SITE_OTHER): Payer: BLUE CROSS/BLUE SHIELD | Admitting: Neurology

## 2015-02-20 ENCOUNTER — Encounter: Payer: Self-pay | Admitting: Neurology

## 2015-02-20 VITALS — BP 124/84 | HR 68 | Resp 20 | Ht 73.0 in | Wt 210.0 lb

## 2015-02-20 DIAGNOSIS — G4701 Insomnia due to medical condition: Secondary | ICD-10-CM

## 2015-02-20 DIAGNOSIS — G4733 Obstructive sleep apnea (adult) (pediatric): Secondary | ICD-10-CM | POA: Diagnosis not present

## 2015-02-20 MED ORDER — ZOLPIDEM TARTRATE 10 MG PO TABS: 10.0000 mg | ORAL_TABLET | Freq: Every evening | ORAL | Status: DC | PRN

## 2015-02-20 NOTE — Progress Notes (Signed)
S[facility]INE [facility]   Provider:  Melvyn Novas, M D  Referring Provider: Noni Saupe, MD Primary Care Physician:  Noni Saupe., MD  Chief Complaint  Patient presents with  . Follow-up    has gotten the oral appliance, hasn't had a repeat study yet, rm 10, alone    HPI:  JEROMY BORCHERDING III is a caucasian, right handed , married 59 y.o. male , seen here originally as a referral from Dr. Jeanie Sewer for a sleep consultation.   02-20-15 Now seen in yearly revisit after SPLIT night polysomnography, 03-20-14, and CPAP follow up.  His baseline AHI was 20 and his RDI 21.6. He had no significant oxygen desaturations. He was titrated to 10 cm water pressure. I'm able today to ask is a download of his C-Flex. This shows that he is using an average CPAP pressure of about 7 cm water, and that his AHI varies greatly from 3.3-8.7 years only use a device 33.3% of the time is currently using an AutoSet between 5 and 15 cm water pressure window was 3 cm EPR and the humidifier is set at 5. Non compliance due to intolerance , Epworth 21/ 24 - dangerous .  He needs an alternative to CPAP. Ambien helped with nocturnal sleep, I am happy to refill at 10 mg .  I had arranged a dental referral to Dr. Irene Limbo, DDS. He is now using the dental device for about 1 week.  Epworth 8 and FSS 18.       HISTORY.  Mr. Lipson had undergone an overnight pulse oximetry to screen for apnea it showed one hour and 4 minutes of oxygen levels at or below 89% saturation 27 minutes at or below 88%. This hypoxemia result letter to a referral for a split-night polysomnography. The split-night polysomnogram was performed on 12-20 9-15 almost the patient endorsed the Epworth score at 13 points at the time had a very long sleep latency obviously difficulties to fall asleep and stay asleep. Most evident was that his AHI was 20 and his RDI 21.6. The supine AHI was 26.8 but there was no hypoxemia noticed in the  diagnostic part of this study. We then followed with the titration and the patient did well at 8 cm water pressure with an AHI of 2.8. This moderate degree of sleep apnea was not associated with hypoxemia not associated with REM sleep exacerbation or positionally influenced. I wrote that the patient may also respond to a dental device instead of the CPAP should he have trouble adjusting to it. I have a download available this patient received a dream station CPAP machine with a setting starting at 5 and increasing to a peak of 15 cm water pressure,  uses 90% of the time 9.4 cm water pressure  this patient. His average time in air leak is 5 minutes and 24 seconds at night average user time 7 hours and 41 minutes.  He used the device 83.3% of the time,  which is an satisfying compliance. He just received a new mask that seems to fit him better it also documents a lower AHI. After he received a better fitting mask and has less air leaks he does acknowledge that he feels better using CPAP is less fatigued and less daytime sleepy. He has no change in his nocturnal bathroom take frequency initially his residual AHI was rather high accompanied by a high air leak. It was 10.6 . The last 10 days the AHI has been  reduced to less than 5. Your longer has any insomnia complaint. He tried Belsomra with very little effect-  he has been doing well with 5 mg of Ambien ( when necessary ) but hasn't used it in over 3 weeks.  He does not request any refills either. Epworth Sleepiness Scale on CPAP is 3 points down from 13 prior to CPAP use and his fatigue severity score is 12 points.  [employer] changed his interface from a dream ware mask to  a nasal pillow by resmed, he has a dream station CPAP 5-15 cm water  and a heated tube. , no condensation water.    Sleep habits ;  [name] reports that he for the last 6 months has noted progressive insomnia by now considered chronic. He has been a life long light sleeper, and easy  to wake up, but usually felt oriented and refreshed in the morning. He has no difficulty falling asleep, but staying asleep.  He goes to bed around 10 PM and promptly sleeps,  No TV and no reading, bedroom  Is cool, quiet and dark.  He has no curtains , big window front.  Likes the morning light waking him.  Lives remote, no traffic sounds or lights.  He wakes up at 2  ( unsure why ) and from then on tosses and turns and has trouble to sleep again. He doesn' t have to go to the bathroom, he doesn't snore , he sometimes gasps, not clear if this is apnea. His wife a is a deep and sound sleeper. He sets an alarm, but always wakes up before the alarm set off.  He travels a lot for business. Not many time zone changes, He has some aches and pain, arthritis of neck and shoulders, tennis related injuries, but this is not new and not sleep disruptive.  He drinks only 3-4 glasses of red wine weekly, at the most.  He used melatonin and GABA .  He used vitamins , got off coffee- but still caffeine, green tea only in winter.  Dr. Jeanie Seweredding prescribed  5 mg Ambien, which did work well  .  He sleeps better in a HOTEL than at home! He feels hypervigilant at home.  He may be a good Belsomra candidate.   CD , Dec 2015            Review of Systems:    Out of a complete 14 system review, the patient complains of only the following symptoms, and all other reviewed systems are negative.   Epworth score   21 up from 13 , Fatigue severity score  52 from 30  , depression score 1- intolerant of CPAP.   Social History   Social History  . Marital Status: Married    Spouse Name: N/A  . Number of Children: N/A  . Years of Education: N/A   Occupational History  . Not on file.   Social History Main Topics  . Smoking status: Never Smoker   . Smokeless tobacco: Never Used  . Alcohol Use: 0.0 oz/week    0 Standard drinks or equivalent per week     Comment: 3-4 wine glasses a week  . Drug Use: No  . Sexual  Activity: Not on file   Other Topics Concern  . Not on file   Social History Narrative   Right handed. Married, 3 kids, College grad, Caffeine 2 cups daily    Family History  Problem Relation Age of Onset  . Arthritis Mother   .  Cancer Mother   . Arthritis Father   . Cancer Father   . Cancer Maternal Grandmother   . Cancer Paternal Grandmother   . Cancer Maternal Grandfather   . Heart disease Paternal Grandfather     Past Medical History  Diagnosis Date  . Arthritis   . Hyperlipidemia   . Arthritis   . MVP (mitral valve prolapse)     H/O  . Allergic rhinitis   . Hypercholesteremia   . Insomnia, persistent 01/26/2014  . OSA on CPAP 05/23/2014    Past Surgical History  Procedure Laterality Date  . Tonsillectomy and adenoidectomy  1985    Current Outpatient Prescriptions  Medication Sig Dispense Refill  . cholecalciferol (VITAMIN D) 1000 UNITS tablet Take 2,000 Units by mouth daily.    Marland Kitchen ibuprofen (ADVIL,MOTRIN) 400 MG tablet Take 400 mg by mouth every 6 (six) hours as needed (Takes when plays tennis (3 x week)).    Marland Kitchen LIVALO 2 MG TABS Take 1 tablet by mouth daily.  4  . Multiple Vitamins-Minerals (MULTIVITAMIN WITH MINERALS) tablet Take 1 tablet by mouth daily.    . Omega-3 Fatty Acids (FISH OIL) 1200 MG CAPS Take 2 capsules by mouth daily.     . sildenafil (VIAGRA) 100 MG tablet Take 100 mg by mouth daily as needed for erectile dysfunction (1/2 - 1 tablet daily prn).    . zolpidem (AMBIEN) 10 MG tablet Take 10 mg by mouth at bedtime as needed for sleep. Take 5 mg as needed at bedtime for sleep.    . Suvorexant (BELSOMRA) 15 MG TABS Take 15 mg by mouth Nightly. (Patient not taking: Reported on 02/20/2015) 10 tablet 1   No current facility-administered medications for this visit.    Allergies as of 02/20/2015 - Review Complete 02/20/2015  Allergen Reaction Noted  . Ace inhibitors Cough 01/25/2014  . Lipitor [atorvastatin]  01/25/2014  . Pravastatin sodium  01/25/2014   . Welchol [colesevelam hcl]  01/25/2014    Vitals: BP 124/84 mmHg  Pulse 68  Resp 20  Ht  (1.854 m)  Wt 210 lb (95.255 kg)  BMI 27.71 kg/m2 Last Weight:  Wt Readings from Last 1 Encounters:  02/20/15 210 lb (95.255 kg)       Last Height:   Ht Readings from Last 1 Encounters:  02/20/15  (1.854 m)    Physical exam:  General: The patient is awake, alert and appears not in acute distress. The patient is well groomed. Head: Normocephalic, atraumatic. Neck is supple. Mallampati 3  neck circumference: 17.25  Nasal airflow unrestricted , TMJ is not evident . Retrognathia is not seen.  Cardiovascular:  Regular rate and rhythm, without  murmurs or carotid bruit, and without distended neck veins. Respiratory: Lungs are clear to auscultation. Skin:  Without evidence of edema, or rash Trunk: BMI is  elevated , normal posture.  Neurologic exam : The patient is awake and alert, oriented to place and time.   Memory subjective described as intact. There is a normal attention span & concentration ability. Speech is fluent without dysarthria, dysphonia or aphasia. Mood and affect are appropriate.  Cranial nerves: Pupils are equal and briskly reactive to light. Funduscopic exam without evidence of pallor or edema. Extraocular movements  in vertical and horizontal planes intact and without nystagmus. Visual fields by finger perimetry are intact. Hearing to finger rub intact.  Facial sensation intact to fine touch. Facial motor strength is symmetric and tongue and uvula move midline.  Motor exam:  Normal tone, muscle bulk and symmetric strength in all extremities.Vibration tested in all extremities.Proprioception  normal.  upper and lower extremities DTR are symmetric and intact. Babinski maneuver response is downgoing.  Assessment:  After physical and neurologic examination, review of laboratory studies, imaging, neurophysiology testing and pre-existing records, assessment is   1)  OSA- treated with dental device. Snoring was commended on the home sleep test arranged by Dr. Irene Limbo, DDS. 2) insomnia -not longer present    The patient was advised of the nature of the diagnosed sleep disorder , the treatment options and risks for general a health and wellness arising from not treating the condition. Visit duration was 20  minutes.   Plan:  Treatment plan and additional workup :  Continue using the dental device, a [employer] dental advancement application. Patient is very satisfied with the outcome of his dental visits. I will follow Mr. Fiorello as needed from now on. I will refill his Ambien today.    Porfirio Mylar Neely Cecena MD  02/20/2015

## 2015-07-10 DIAGNOSIS — I2510 Atherosclerotic heart disease of native coronary artery without angina pectoris: Secondary | ICD-10-CM | POA: Insufficient documentation

## 2015-07-10 DIAGNOSIS — E785 Hyperlipidemia, unspecified: Secondary | ICD-10-CM | POA: Insufficient documentation

## 2015-07-10 HISTORY — DX: Atherosclerotic heart disease of native coronary artery without angina pectoris: I25.10

## 2015-07-10 HISTORY — DX: Hyperlipidemia, unspecified: E78.5

## 2015-11-18 ENCOUNTER — Other Ambulatory Visit: Payer: Self-pay | Admitting: Neurology

## 2015-11-18 DIAGNOSIS — G4701 Insomnia due to medical condition: Secondary | ICD-10-CM

## 2015-11-18 DIAGNOSIS — G4733 Obstructive sleep apnea (adult) (pediatric): Secondary | ICD-10-CM

## 2015-11-18 NOTE — Telephone Encounter (Signed)
RX for ambien faxed to [facility]. Received a receipt of confirmation.  

## 2016-01-23 ENCOUNTER — Other Ambulatory Visit: Payer: Self-pay | Admitting: Physician Assistant

## 2016-01-23 DIAGNOSIS — M542 Cervicalgia: Secondary | ICD-10-CM

## 2016-01-29 ENCOUNTER — Ambulatory Visit
Admission: RE | Admit: 2016-01-29 | Discharge: 2016-01-29 | Disposition: A | Discharge status: Home / Self Care | Payer: BLUE CROSS/BLUE SHIELD | Source: Ambulatory Visit | Attending: Physician Assistant | Admitting: Physician Assistant

## 2016-01-29 DIAGNOSIS — M542 Cervicalgia: Secondary | ICD-10-CM

## 2016-08-19 ENCOUNTER — Other Ambulatory Visit: Payer: Self-pay | Admitting: Neurology

## 2016-08-20 ENCOUNTER — Telehealth: Payer: Self-pay | Admitting: Neurology

## 2016-08-20 NOTE — Telephone Encounter (Signed)
Noted  

## 2016-08-20 NOTE — Telephone Encounter (Signed)
Pharmacy sent refill request for zolpidem but it was denied by Dr Vickey Hugerohmeier, pt needed an appt, last seen 02/20/2015 [facility]

## 2016-08-24 ENCOUNTER — Ambulatory Visit (INDEPENDENT_AMBULATORY_CARE_PROVIDER_SITE_OTHER): Payer: BLUE CROSS/BLUE SHIELD | Admitting: Adult Health

## 2016-08-24 ENCOUNTER — Encounter: Payer: Self-pay | Admitting: Adult Health

## 2016-08-24 ENCOUNTER — Encounter (INDEPENDENT_AMBULATORY_CARE_PROVIDER_SITE_OTHER): Payer: Self-pay

## 2016-08-24 DIAGNOSIS — G4733 Obstructive sleep apnea (adult) (pediatric): Secondary | ICD-10-CM

## 2016-08-24 DIAGNOSIS — G4701 Insomnia due to medical condition: Secondary | ICD-10-CM | POA: Diagnosis not present

## 2016-08-24 MED ORDER — ZOLPIDEM TARTRATE 10 MG PO TABS: 10.0000 mg | ORAL_TABLET | Freq: Every evening | ORAL | 5 refills | Status: DC | PRN

## 2016-08-24 NOTE — Progress Notes (Signed)
I agree with the assessment and plan as directed by NP .The patient is known to me .   Jourdan Maldonado, MD  

## 2016-08-24 NOTE — Patient Instructions (Signed)
Ambien refilled- can ask PCP to fill  Continue using Dental device Can follow-up if needed.

## 2016-08-24 NOTE — Progress Notes (Signed)
PATIENT: Jeremiah Dickson DOB: June 15, 1955  REASON FOR VISIT: follow up- OSA, insomnia HISTORY FROM: patient  HISTORY OF PRESENT ILLNESS:  Today 08/24/16: [name] is a 61 year old male with a history of obstructive sleep apnea and insomnia. He returns today for follow-up. He reports that he continues to use a dental device when sleeping. Reports that this is working well for him. He also takes Ambien 0.5-1 tablet at bedtime. He reports that since he began using the dental device and taking Ambien his sleep has improved dramatically. He gets approximately 5-6 hours of sleep each night. He reports that historically he has not been a good sleeper. He denies any new neurological symptoms. He returns today for a medication refill.  HISTORY Jeremiah Dickson Dickson is a caucasian, right handed , married 61 y.o. male , seen here originally as a referral from Dr. Jeanie Sewer for a sleep consultation.   02-20-15 Now seen in yearly revisit after SPLIT night polysomnography, 03-20-14, and CPAP follow up.  His baseline AHI was 20 and his RDI 21.6. He had no significant oxygen desaturations. He was titrated to 10 cm water pressure. I'm able today to ask is a download of his C-Flex. This shows that he is using an average CPAP pressure of about 7 cm water, and that his AHI varies greatly from 3.3-8.7 years only use a device 33.3% of the time is currently using an AutoSet between 5 and 15 cm water pressure window was 3 cm EPR and the humidifier is set at 5. Non compliance due to intolerance , Epworth 21/ 24 - dangerous .  He needs an alternative to CPAP. Ambien helped with nocturnal sleep, I am happy to refill at 10 mg .  I had arranged a dental referral to Dr. Irene Limbo, DDS. He is now using the dental device for about 1 week.  Epworth 8 and FSS 18.       HISTORY.  Mr. Jeremiah Dickson had undergone an overnight pulse oximetry to screen for apnea it showed one hour and 4 minutes of oxygen levels at or below  89% saturation 27 minutes at or below 88%. This hypoxemia result letter to a referral for a split-night polysomnography. The split-night polysomnogram was performed on 12-20 9-15 almost the patient endorsed the Epworth score at 13 points at the time had a very long sleep latency obviously difficulties to fall asleep and stay asleep. Most evident was that his AHI was 20 and his RDI 21.6. The supine AHI was 26.8 but there was no hypoxemia noticed in the diagnostic part of this study. We then followed with the titration and the patient did well at 8 cm water pressure with an AHI of 2.8. This moderate degree of sleep apnea was not associated with hypoxemia not associated with REM sleep exacerbation or positionally influenced. I wrote that the patient may also respond to a dental device instead of the CPAP should he have trouble adjusting to it. I have a download available this patient received a dream station CPAP machine with a setting starting at 5 and increasing to a peak of 15 cm water pressure,  uses 90% of the time 9.4 cm water pressure  this patient. His average time in air leak is 5 minutes and 24 seconds at night average user time 7 hours and 41 minutes.  He used the device 83.3% of the time,  which is an satisfying compliance. He just received a new mask that seems to fit him better  it also documents a lower AHI. After he received a better fitting mask and has less air leaks he does acknowledge that he feels better using CPAP is less fatigued and less daytime sleepy. He has no change in his nocturnal bathroom take frequency initially his residual AHI was rather high accompanied by a high air leak. It was 10.6 . The last 10 days the AHI has been reduced to less than 5. Your longer has any insomnia complaint. He tried Belsomra with very little effect-  he has been doing well with 5 mg of Ambien ( when necessary ) but hasn't used it in over 3 weeks.  He does not request any refills either. Epworth Sleepiness  Scale on CPAP is 3 points down from 13 prior to CPAP use and his fatigue severity score is 12 points.  [employer] changed his interface from a dream ware mask to  a nasal pillow by resmed, he has a dream station CPAP 5-15 cm water  and a heated tube. , no condensation water.    Sleep habits ;  Mr. Jeremiah Dickson reports that he for the last 6 months has noted progressive insomnia by now considered chronic. He has been a life long light sleeper, and easy to wake up, but usually felt oriented and refreshed in the morning. He has no difficulty falling asleep, but staying asleep.  He goes to bed around 10 PM and promptly sleeps,  No TV and no reading, bedroom  Is cool, quiet and dark.  He has no curtains , big window front.  Likes the morning light waking him.  Lives remote, no traffic sounds or lights.  He wakes up at 2  ( unsure why ) and from then on tosses and turns and has trouble to sleep again. He doesn' t have to go to the bathroom, he doesn't snore , he sometimes gasps, not clear if this is apnea. His wife a is a deep and sound sleeper. He sets an alarm, but always wakes up before the alarm set off.  He travels a lot for business. Not many time zone changes, He has some aches and pain, arthritis of neck and shoulders, tennis related injuries, but this is not new and not sleep disruptive.  He drinks only 3-4 glasses of red wine weekly, at the most.  He used melatonin and GABA .  He used vitamins , got off coffee- but still caffeine, green tea only in winter.  Dr. Jeanie Sewer prescribed  5 mg Ambien, which did work well  .  He sleeps better in a HOTEL than at home! He feels hypervigilant at home.  He may be a good Belsomra candidate.   CD , Dec 2015    REVIEW OF SYSTEMS: Out of a complete 14 system review of symptoms, the patient complains only of the following symptoms, and all other reviewed systems are negative.  Apnea, Epworth sleepiness score 4 fatigue severity score 12 ALLERGIES: Allergies    Allergen Reactions  . Ace Inhibitors Cough  . Lipitor [Atorvastatin]   . Pravastatin Sodium   . Welchol [Colesevelam Hcl]     HOME MEDICATIONS: Outpatient Medications Prior to Visit  Medication Sig Dispense Refill  . cholecalciferol (VITAMIN D) 1000 UNITS tablet Take 2,000 Units by mouth daily.    Marland Kitchen ibuprofen (ADVIL,MOTRIN) 400 MG tablet Take 400 mg by mouth every 6 (six) hours as needed (Takes when plays tennis (3 x week)).    Marland Kitchen LIVALO 2 MG TABS Take 1 tablet by mouth  daily.  4  . Multiple Vitamins-Minerals (MULTIVITAMIN WITH MINERALS) tablet Take 1 tablet by mouth daily.    . Omega-3 Fatty Acids (FISH OIL) 1200 MG CAPS Take 2 capsules by mouth daily.     . sildenafil (VIAGRA) 100 MG tablet Take 100 mg by mouth daily as needed for erectile dysfunction (1/2 - 1 tablet daily prn).    . Suvorexant (BELSOMRA) 15 MG TABS Take 15 mg by mouth Nightly. (Patient not taking: Reported on 02/20/2015) 10 tablet 1  . zolpidem (AMBIEN) 10 MG tablet Take 1 tablet (10 mg total) by mouth at bedtime as needed for sleep. 30 tablet 0  . zolpidem (AMBIEN) 10 MG tablet TAKE 1 TABLET BY MOUTH EVERY NIGHT AT BEDTIME AS NEEDED FOR SLEEP 30 tablet 3   No facility-administered medications prior to visit.     PAST MEDICAL HISTORY: Past Medical History:  Diagnosis Date  . Allergic rhinitis   . Arthritis   . Arthritis   . Hypercholesteremia   . Hyperlipidemia   . Insomnia, persistent 01/26/2014  . MVP (mitral valve prolapse)    H/O  . OSA on CPAP 05/23/2014    PAST SURGICAL HISTORY: Past Surgical History:  Procedure Laterality Date  . TONSILLECTOMY AND ADENOIDECTOMY  1985    FAMILY HISTORY: Family History  Problem Relation Age of Onset  . Arthritis Mother   . Cancer Mother   . Arthritis Father   . Cancer Father   . Cancer Maternal Grandmother   . Cancer Paternal Grandmother   . Cancer Maternal Grandfather   . Heart disease Paternal Grandfather     SOCIAL HISTORY: Social History   Social  History  . Marital status: Married    Spouse name: N/A  . Number of children: N/A  . Years of education: N/A   Occupational History  . Not on file.   Social History Main Topics  . Smoking status: Never Smoker  . Smokeless tobacco: Never Used  . Alcohol use 0.0 oz/week     Comment: 3-4 wine glasses a week  . Drug use: No  . Sexual activity: Not on file   Other Topics Concern  . Not on file   Social History Narrative   Right handed. Married, 3 kids, College grad, Caffeine 2 cups daily      PHYSICAL EXAM  Vitals:   08/24/16 1350  BP: 118/75  Pulse: 74  Weight: 208 lb 3.2 oz (94.4 kg)  Height: 6\' 1"  (1.854 m)   There is no height or weight on file to calculate BMI.  Generalized: Well developed, in no acute distress   Neurological examination  Mentation: Alert oriented to time, place, history taking. Follows all commands speech and language fluent Cranial nerve II-XII: Pupils were equal round reactive to light. Extraocular movements were full, visual field were full on confrontational test. Facial sensation and strength were normal. Uvula tongue midline. Head turning and shoulder shrug  were normal and symmetric. Motor: The motor testing reveals 5 over 5 strength of all 4 extremities. Good symmetric motor tone is noted throughout.  Sensory: Sensory testing is intact to soft touch on all 4 extremities. No evidence of extinction is noted.  Coordination: Cerebellar testing reveals good finger-nose-finger and heel-to-shin bilaterally.  Gait and station: Gait is normal.  Reflexes: Deep tendon reflexes are symmetric and normal bilaterally.   DIAGNOSTIC DATA (LABS, IMAGING, TESTING) - I reviewed patient records, labs, notes, testing and imaging myself where available.     ASSESSMENT AND PLAN 60  y.o. year old male  has a past medical history of Allergic rhinitis; Arthritis; Arthritis; Hypercholesteremia; Hyperlipidemia; Insomnia, persistent (01/26/2014); MVP (mitral valve  prolapse); and OSA on CPAP (05/23/2014). here with:  1. Obstructive sleep apnea 2. Insomnia  Patient is encouraged to continue using his dental device nightly to treat sleep apnea. He will continue on Ambien as needed for sleep. I advised the patient that he can ask that his primary care take over the Ambien prescription. He can follow up with our office on an as-needed basis.    Butch Penny, MSN, NP-C 08/24/2016, 1:48 PM [facility] [address], [address] [place], [place] [address] [phone]

## 2016-08-24 NOTE — Progress Notes (Signed)
Fax confirmation received for ambien to [phone]. sy

## 2017-02-26 ENCOUNTER — Other Ambulatory Visit: Payer: Self-pay | Admitting: Adult Health

## 2017-02-26 DIAGNOSIS — G4701 Insomnia due to medical condition: Secondary | ICD-10-CM

## 2017-02-26 DIAGNOSIS — G4733 Obstructive sleep apnea (adult) (pediatric): Secondary | ICD-10-CM

## 2018-01-05 ENCOUNTER — Ambulatory Visit: Payer: BLUE CROSS/BLUE SHIELD | Admitting: Cardiology

## 2018-01-05 VITALS — BP 120/80 | HR 66 | Ht 73.0 in | Wt 188.2 lb

## 2018-01-05 DIAGNOSIS — I2510 Atherosclerotic heart disease of native coronary artery without angina pectoris: Secondary | ICD-10-CM

## 2018-01-05 MED ORDER — EZETIMIBE 10 MG PO TABS: 10.0000 mg | ORAL_TABLET | Freq: Every day | ORAL | 3 refills | Status: DC

## 2018-01-05 NOTE — Patient Instructions (Signed)
Medication Instructions:  Your physician has recommended you make the following change in your medication:   START: Zetia 10 mg daily.   If you need a refill on your cardiac medications before your next appointment, please call your pharmacy.   Lab work: None.  If you have labs (blood work) drawn today and your tests are completely normal, you will receive your results only by: Marland Kitchen MyChart Message (if you have MyChart) OR . A paper copy in the mail If you have any lab test that is abnormal or we need to change your treatment, we will call you to review the results.  Testing/Procedures: None.   Follow-Up: At [facility], you and your health needs are our priority.  As part of our continuing mission to provide you with exceptional heart care, we have created designated Provider Care Teams.  These Care Teams include your primary Cardiologist (physician) and Advanced Practice Providers (APPs -  Physician Assistants and Nurse Practitioners) who all work together to provide you with the care you need, when you need it. You will need a follow up appointment in 6 months.  Please call our office 2 months in advance to schedule this appointment.  You may see No primary care provider on file. or another member of our [employer] Provider Team in [place]: Norman Herrlich, MD . Belva Crome, MD  Any Other Special Instructions Will Be Listed Below (If Applicable).  Ezetimibe Tablets What is this medicine? EZETIMIBE (ez ET i mibe) blocks the absorption of cholesterol from the stomach. It can help lower blood cholesterol for patients who are at risk of getting heart disease or a stroke. It is only for patients whose cholesterol level is not controlled by diet. This medicine may be used for other purposes; ask your health care provider or pharmacist if you have questions. COMMON BRAND NAME(S): Zetia What should I tell my health care provider before I take this medicine? They need to know if you  have any of these conditions: -liver disease -an unusual or allergic reaction to ezetimibe, medicines, foods, dyes, or preservatives -pregnant or trying to get pregnant -breast-feeding How should I use this medicine? Take this medicine by mouth with a glass of water. Follow the directions on the prescription label. This medicine can be taken with or without food. Take your doses at regular intervals. Do not take your medicine more often than directed. Talk to your pediatrician regarding the use of this medicine in children. Special care may be needed. Overdosage: If you think you have taken too much of this medicine contact a poison control center or emergency room at once. NOTE: This medicine is only for you. Do not share this medicine with others. What if I miss a dose? If you miss a dose, take it as soon as you can. If it is almost time for your next dose, take only that dose. Do not take double or extra doses. What may interact with this medicine? Do not take this medicine with any of the following medications: -fenofibrate -gemfibrozil This medicine may also interact with the following medications: -antacids -cyclosporine -herbal medicines like red yeast rice -other medicines to lower cholesterol or triglycerides This list may not describe all possible interactions. Give your health care provider a list of all the medicines, herbs, non-prescription drugs, or dietary supplements you use. Also tell them if you smoke, drink alcohol, or use illegal drugs. Some items may interact with your medicine. What should I watch for while using this  medicine? Visit your doctor or health care professional for regular checks on your progress. You will need to have your cholesterol levels checked. If you are also taking some other cholesterol medicines, you will also need to have tests to make sure your liver is working properly. Tell your doctor or health care professional if you get any unexplained  muscle pain, tenderness, or weakness, especially if you also have a fever and tiredness. You need to follow a low-cholesterol, low-fat diet while you are taking this medicine. This will decrease your risk of getting heart and blood vessel disease. Exercising and avoiding alcohol and smoking can also help. Ask your doctor or dietician for advice. What side effects may I notice from receiving this medicine? Side effects that you should report to your doctor or health care professional as soon as possible: -allergic reactions like skin rash, itching or hives, swelling of the face, lips, or tongue -dark yellow or brown urine -unusually weak or tired -yellowing of the skin or eyes Side effects that usually do not require medical attention (report to your doctor or health care professional if they continue or are bothersome): -diarrhea -dizziness -headache -stomach upset or pain This list may not describe all possible side effects. Call your doctor for medical advice about side effects. You may report side effects to FDA at 1-800-FDA-1088. Where should I keep my medicine? Keep out of the reach of children. Store at room temperature between 15 and 30 degrees C (59 and 86 degrees F). Protect from moisture. Keep container tightly closed. Throw away any unused medicine after the expiration date. NOTE: This sheet is a summary. It may not cover all possible information. If you have questions about this medicine, talk to your doctor, pharmacist, or health care provider.  2018 Elsevier/Gold Standard (2011-09-14 15:39:09)

## 2018-01-05 NOTE — Progress Notes (Signed)
Cardiology Office Note:    Date:  01/05/2018   ID:  Daphene Jaeger, DOB [birth year], MRN [id number]  PCP:  Noni Saupe, MD  Cardiologist:  Gypsy Balsam, MD    Referring MD: Noni Saupe, MD   Chief Complaint  Patient presents with  . Follow-up  Doing very well  History of Present Illness:    Jeremiah Dickson is a 62 y.o. male with cardiac catheterization done years ago which showed some luminal disease.  Also dyslipidemia with intolerance to statin.  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  He is doing well no chest pain tightness squeezing pressure burning chest no shortness of breath.  Past Medical History:  Diagnosis Date  . Allergic rhinitis   . Arthritis   . Arthritis   . Hypercholesteremia   . Hyperlipidemia   . Insomnia, persistent 01/26/2014  . MVP (mitral valve prolapse)    H/O  . OSA on CPAP 05/23/2014    Past Surgical History:  Procedure Laterality Date  . neck fusion surgery     Dr, Rubye Beach NS 02/2016  . TONSILLECTOMY AND ADENOIDECTOMY  1985    Current Medications: Current Meds  Medication Sig  . cholecalciferol (VITAMIN D) 1000 UNITS tablet Take 2,000 Units by mouth daily.  . Coenzyme Q10 (COQ10 PO) Take by mouth.  Marland Kitchen ibuprofen (ADVIL,MOTRIN) 400 MG tablet Take 400 mg by mouth every 6 (six) hours as needed (Takes when plays tennis (3 x week)).  . Multiple Vitamins-Minerals (MULTIVITAMIN WITH MINERALS) tablet Take 1 tablet by mouth daily.  . Omega-3 Fatty Acids (FISH OIL) 1200 MG CAPS Take 2 capsules by mouth daily.   . sildenafil (VIAGRA) 100 MG tablet Take 100 mg by mouth daily as needed for erectile dysfunction (1/2 - 1 tablet daily prn).  . zolpidem (AMBIEN) 10 MG tablet Take 10 mg by mouth at bedtime as needed for sleep.     Allergies:   Ace inhibitors; Lipitor [atorvastatin]; Pravastatin sodium; and Welchol [colesevelam hcl]    Social History   Socioeconomic History  . Marital status: Married    Spouse name: Not on file  . Number of children: Not on file  . Years of education: Not on file  . Highest education level: Not on file  Occupational History  . Not on file  Social Needs  . Financial resource strain: Not on file  . Food insecurity:    Worry: Not on file    Inability: Not on file  . Transportation needs:    Medical: Not on file    Non-medical: Not on file  Tobacco Use  . Smoking status: Never Smoker  . Smokeless tobacco: Never Used  Substance and Sexual Activity  . Alcohol use: Yes    Alcohol/week: 0.0 standard drinks    Comment: 3-4 wine glasses a week  . Drug use: No  . Sexual activity: Not on file  Lifestyle  . Physical activity:    Days per week: Not on file    Minutes per session: Not on file  . Stress: Not on file  Relationships  . Social connections:    Talks on phone: Not on file    Gets together: Not on file    Attends religious service: Not on file    Active member of club or organization: Not on file    Attends meetings of  clubs or organizations: Not on file    Relationship status: Not on file  Other Topics Concern  . Not on file  Social History Narrative   Right handed. Married, 3 kids, College grad, Caffeine 2 cups daily     Family History: The patient's family history includes Arthritis in his father and mother; Cancer in his father, maternal grandfather, maternal grandmother, mother, and paternal grandmother; Heart disease in his paternal grandfather. ROS:   Please see the history of present illness.    All 14 point review of systems negative except as described per history of present illness  EKGs/Labs/Other Studies Reviewed:      Recent Labs: No results found for requested labs within last 8760 hours.  Recent Lipid Panel No results found for: CHOL, TRIG, HDL, CHOLHDL, VLDL, LDLCALC, LDLDIRECT  Physical Exam:    VS:  BP 120/80   Pulse 66   Ht 6\' 1"   (1.854 m)   Wt 188 lb 3.2 oz (85.4 kg)   SpO2 98%   BMI 24.83 kg/m     Wt Readings from Last 3 Encounters:  01/05/18 188 lb 3.2 oz (85.4 kg)  08/24/16 208 lb 3.2 oz (94.4 kg)  02/20/15 210 lb (95.3 kg)     GEN:  Well nourished, well developed in no acute distress HEENT: Normal NECK: No JVD; No carotid bruits LYMPHATICS: No lymphadenopathy CARDIAC: RRR, no murmurs, no rubs, no gallops RESPIRATORY:  Clear to auscultation without rales, wheezing or rhonchi  ABDOMEN: Soft, non-tender, non-distended MUSCULOSKELETAL:  No edema; No deformity  SKIN: Warm and dry LOWER EXTREMITIES: no swelling NEUROLOGIC:  Alert and oriented x 3 PSYCHIATRIC:  Normal affect   ASSESSMENT:    No diagnosis found. PLAN:    In order of problems listed above:  1. Coronary artery disease with luminal disease based on cardiac catheterization few years ago advised him to start taking one baby aspirin's every single day.  He is asymptomatic he eats well exercise on the regular basis which I encouraged him to continue. 2. Dyslipidemia with intolerance to statin.  We will start him on Zetia 10 mg daily for 6 weeks later we will recheck his fasting lipid profile. 3. We talked about healthy lifestyle exercises on the regular basis good diet I strongly recommend yoga for him since he does have such a stressful lifestyle.   Medication Adjustments/Labs and Tests Ordered: Current medicines are reviewed at length with the patient today.  Concerns regarding medicines are outlined above.  No orders of the defined types were placed in this encounter.  Medication changes: No orders of the defined types were placed in this encounter.   Signed, Georgeanna Lea, MD, [facility] 01/05/2018 12:27 PM     [facility]

## 2018-07-11 ENCOUNTER — Telehealth: Payer: Self-pay | Admitting: Cardiology

## 2018-07-11 NOTE — Telephone Encounter (Signed)
Virtual Visit Pre-Appointment Phone Call  Steps For Call:  1. Confirm consent - "In the setting of the current Covid19 crisis, you are scheduled for a (phone or video) visit with your provider on (date) at (time).  Just as we do with many in-office visits, in order for you to participate in this visit, we must obtain consent.  If you'd like, I can send this to your mychart (if signed up) or email for you to review.  Otherwise, I can obtain your verbal consent now.  All virtual visits are billed to your insurance company just like a normal visit would be.  By agreeing to a virtual visit, we'd like you to understand that the technology does not allow for your provider to perform an examination, and thus may limit your provider's ability to fully assess your condition. If your provider identifies any concerns that need to be evaluated in person, we will make arrangements to do so.  Finally, though the technology is pretty good, we cannot assure that it will always work on either your or our end, and in the setting of a video visit, we may have to convert it to a phone-only visit.  In either situation, we cannot ensure that we have a secure connection.  Are you willing to proceed?" STAFF: Did the patient verbally acknowledge consent to telehealth visit? Document YES/NO here: yes  2. Confirm the BEST phone number to call the day of the visit by including in appointment notes  3. Give patient instructions for WebEx/MyChart download to smartphone as below or Doximity/Doxy.me if video visit (depending on what platform provider is using)  4. Advise patient to be prepared with their blood pressure, heart rate, weight, any heart rhythm information, their current medicines, and a piece of paper and pen handy for any instructions they may receive the day of their visit  5. Inform patient they will receive a phone call 15 minutes prior to their appointment time (may be from unknown caller ID) so they should be  prepared to answer  6. Confirm that appointment type is correct in Epic appointment notes (VIDEO vs PHONE)     TELEPHONE CALL NOTE  Jeremiah Dickson has been deemed a candidate for a follow-up tele-health visit to limit community exposure during the Covid-19 pandemic. I spoke with the patient via phone to ensure availability of phone/video source, confirm preferred email & phone number, and discuss instructions and expectations.  I reminded Jeremiah Dickson to be prepared with any vital sign and/or heart rhythm information that could potentially be obtained via home monitoring, at the time of his visit. I reminded Jeremiah Dickson to expect a phone call at the time of his visit if his visit.  Kittie Plater 07/11/2018 1:39 PM   INSTRUCTIONS FOR DOWNLOADING THE WEBEX APP TO SMARTPHONE  - If Apple, ask patient to go to [employer] and type in WebEx in the search bar. Download [employer], the blue/green circle. If Android, go to [employer] and type in [employer] in the search bar. The app is free but as with any other app downloads, their phone may require them to verify saved payment information or Apple/Android password.  - The patient does NOT have to create an account. - On the day of the visit, the assist will walk the patient through joining the meeting with the meeting number/password.  INSTRUCTIONS FOR DOWNLOADING THE MYCHART APP TO SMARTPHONE  - The patient  must first make sure to have activated MyChart and know their login information - If Apple, go to [employer] and type in MyChart in the search bar and download the app. If Android, ask patient to go to [employer] and type in [place] in the search bar and download the app. The app is free but as with any other app downloads, their phone may require them to verify saved payment information or Apple/Android password.  - The patient will need to then log into the app with their MyChart username and  password, and select [facility] as their healthcare provider to link the account. When it is time for your visit, go to the MyChart app, find appointments, and click Begin Video Visit. Be sure to Select Allow for your device to access the Microphone and Camera for your visit. You will then be connected, and your provider will be with you shortly.  **If they have any issues connecting, or need assistance please contact MyChart service desk [phone])**  **If using a computer, in order to ensure the best quality for their visit they will need to use either of the following Internet Browsers: [employer], or Google Chrome**  IF USING DOXIMITY or DOXY.ME - The patient will receive a link just prior to their visit, either by text or email (to be determined day of appointment depending on if it's doxy.me or Doximity).     FULL LENGTH CONSENT FOR TELE-HEALTH VISIT   I hereby voluntarily request, consent and authorize [facility] and its employed or contracted physicians, physician assistants, nurse practitioners or other licensed health care professionals (the Practitioner), to provide me with telemedicine health care services (the Services") as deemed necessary by the treating Practitioner. I acknowledge and consent to receive the Services by the Practitioner via telemedicine. I understand that the telemedicine visit will involve communicating with the Practitioner through live audiovisual communication technology and the disclosure of certain medical information by electronic transmission. I acknowledge that I have been given the opportunity to request an in-person assessment or other available alternative prior to the telemedicine visit and am voluntarily participating in the telemedicine visit.  I understand that I have the right to withhold or withdraw my consent to the use of telemedicine in the course of my care at any time, without affecting my right to future care or  treatment, and that the Practitioner or I may terminate the telemedicine visit at any time. I understand that I have the right to inspect all information obtained and/or recorded in the course of the telemedicine visit and may receive copies of available information for a reasonable fee.  I understand that some of the potential risks of receiving the Services via telemedicine include:   Delay or interruption in medical evaluation due to technological equipment failure or disruption;  Information transmitted may not be sufficient (e.g. poor resolution of images) to allow for appropriate medical decision making by the Practitioner; and/or   In rare instances, security protocols could fail, causing a breach of personal health information.  Furthermore, I acknowledge that it is my responsibility to provide information about my medical history, conditions and care that is complete and accurate to the best of my ability. I acknowledge that Practitioner's advice, recommendations, and/or decision may be based on factors not within their control, such as incomplete or inaccurate data provided by me or distortions of diagnostic images or specimens that may result from electronic transmissions. I understand that the practice of medicine is  not an Chief Strategy Officer and that Practitioner makes no warranties or guarantees regarding treatment outcomes. I acknowledge that I will receive a copy of this consent concurrently upon execution via email to the email address I last provided but may also request a printed copy by calling the office of [facility].    I understand that my insurance will be billed for this visit.   I have read or had this consent read to me.  I understand the contents of this consent, which adequately explains the benefits and risks of the Services being provided via telemedicine.   I have been provided ample opportunity to ask questions regarding this consent and the Services and have had my  questions answered to my satisfaction.  I give my informed consent for the services to be provided through the use of telemedicine in my medical care  By participating in this telemedicine visit I agree to the above.

## 2018-07-21 ENCOUNTER — Encounter: Payer: Self-pay | Admitting: *Deleted

## 2018-07-21 ENCOUNTER — Telehealth (INDEPENDENT_AMBULATORY_CARE_PROVIDER_SITE_OTHER): Payer: Self-pay | Admitting: Cardiology

## 2018-07-21 ENCOUNTER — Other Ambulatory Visit: Payer: Self-pay

## 2018-07-21 ENCOUNTER — Encounter: Payer: Self-pay | Admitting: Cardiology

## 2018-07-21 DIAGNOSIS — I2510 Atherosclerotic heart disease of native coronary artery without angina pectoris: Secondary | ICD-10-CM

## 2018-07-21 DIAGNOSIS — E785 Hyperlipidemia, unspecified: Secondary | ICD-10-CM

## 2018-07-21 NOTE — Patient Instructions (Signed)
Medication Instructions:  Your physician recommends that you continue on your current medications as directed. Please refer to the Current Medication list given to you today.  If you need a refill on your cardiac medications before your next appointment, please call your pharmacy.   Lab work: None.  If you have labs (blood work) drawn today and your tests are completely normal, you will receive your results only by: . MyChart Message (if you have MyChart) OR . A paper copy in the mail If you have any lab test that is abnormal or we need to change your treatment, we will call you to review the results.  Testing/Procedures: None.   Follow-Up: At [facility], you and your health needs are our priority.  As part of our continuing mission to provide you with exceptional heart care, we have created designated Provider Care Teams.  These Care Teams include your primary Cardiologist (physician) and Advanced Practice Providers (APPs -  Physician Assistants and Nurse Practitioners) who all work together to provide you with the care you need, when you need it. You will need a follow up appointment in 1 years.  Please call our office 2 months in advance to schedule this appointment.  You may see No primary care provider on file. or another member of our [facility] Provider Team in [place]: Brian Munley, MD . Rajan Revankar, MD  Any Other Special Instructions Will Be Listed Below (If Applicable).     

## 2018-07-21 NOTE — Progress Notes (Signed)
Virtual Visit via Video Note   This visit type was conducted due to national recommendations for restrictions regarding the COVID-19 Pandemic (e.g. social distancing) in an effort to limit this patient's exposure and mitigate transmission in our community.  Due to his co-morbid illnesses, this patient is at least at moderate risk for complications without adequate follow up.  This format is felt to be most appropriate for this patient at this time.  All issues noted in this document were discussed and addressed.  A limited physical exam was performed with this format.  Please refer to the patient's chart for his consent to telehealth for [facility].  Evaluation Performed:  Follow-up visit  This visit type was conducted due to national recommendations for restrictions regarding the COVID-19 Pandemic (e.g. social distancing).  This format is felt to be most appropriate for this patient at this time.  All issues noted in this document were discussed and addressed.  No physical exam was performed (except for noted visual exam findings with Video Visits).  Please refer to the patient's chart (MyChart message for video visits and phone note for telephone visits) for the patient's consent to telehealth for [facility].  Date:  07/21/2018  ID: Jeremiah Dickson, DOB 02/16/1956, MRN [id number]   Patient Location: [address] [place] [address]   Provider location:   [facility] [place] [facility]  PCP:  Noni Saupe, MD  Cardiologist:  Gypsy Balsam, MD     Chief Complaint:  Feeling well. 6 month f/u of CAD/DLD.   History of Present Illness:    Jeremiah Dickson is a 63 y.o. male  who presents via audio/video conferencing for a telehealth visit today. PMH dyslipidemia, remote history CAD. No acute complaints. No chest pain/burning/tightness. No SOB or DOE. No palpitations.  He did not take the Zetia sent to the pharmacy as he recognized it as one he had intolerance to  before. Exercising and stretching for one hour 3-4 times per week. Recommended adding aerobic exercise. Reports "feeling really good" and good blood pressure readings. He has some concerns with his diet, but reports overall eating pretty good. Lipid panel checked by PCP will call to request records.   He had questions regarding "Life Screen". We discussed that given his known CAD and current lifestyle modifications this is not necessary, but he may have it done if he wishes.  No indication for carotid ultrasound or echocardiogram at this time as he is asymptomatic from a cardiac standpoint.   The patient does not have symptoms concerning for COVID-19 infection (fever, chills, cough, or new SHORTNESS OF BREATH).   Prior CV studies:   The following studies were reviewed today:  Past Medical History:  Diagnosis Date  . Allergic rhinitis   . Arthritis   . Arthritis   . Hypercholesteremia   . Hyperlipidemia   . Insomnia, persistent 01/26/2014  . MVP (mitral valve prolapse)    H/O  . OSA on CPAP 05/23/2014    Past Surgical History:  Procedure Laterality Date  . neck fusion surgery     Dr, Rubye Beach NS 02/2016  . TONSILLECTOMY AND ADENOIDECTOMY  1985    Current Meds  Medication Sig  . cholecalciferol (VITAMIN D) 1000 UNITS tablet Take 2,000 Units by mouth daily.  . Coenzyme Q10 (COQ10 PO) Take by mouth.  Marland Kitchen ibuprofen (ADVIL,MOTRIN) 400 MG tablet Take 400 mg by mouth every 6 (six) hours as needed (Takes when plays tennis (3  x week)).  . Multiple Vitamins-Minerals (MULTIVITAMIN WITH MINERALS) tablet Take 1 tablet by mouth daily.  . Omega-3 Fatty Acids (FISH OIL) 1200 MG CAPS Take 2 capsules by mouth daily.   . sildenafil (VIAGRA) 100 MG tablet Take 100 mg by mouth daily as needed for erectile dysfunction (1/2 - 1 tablet daily prn).     Family History: The patient's family history includes Arthritis in his father and mother; Cancer in his father, maternal grandfather, maternal  grandmother, mother, and paternal grandmother; Heart disease in his paternal grandfather.   ROS:   Please see the history of present illness.     All other systems reviewed and are negative.   Labs/Other Tests and Data Reviewed:    Recent Labs: No results found for requested labs within last 8760 hours.  Recent Lipid Panel No results found for: CHOL, TRIG, HDL, CHOLHDL, VLDL, LDLCALC, LDLDIRECT   Exam:    Vital Signs:  There were no vitals taken for this visit.    Wt Readings from Last 3 Encounters:  01/05/18 188 lb 3.2 oz (85.4 kg)  08/24/16 208 lb 3.2 oz (94.4 kg)  02/20/15 210 lb (95.3 kg)     Well nourished, well developed in no acute distress. Well appearing. Alert and oriented x3. He is pleasant on conversation via video chat.   Diagnosis for this visit:   1. Coronary artery disease involving native coronary artery of native heart without angina pectoris   2. OSA on CPAP   3. Dyslipidemia     ASSESSMENT & PLAN:    1.  CAD - Luminal disease based on cardiac cath in 2015. Stable, no anginal symptoms. Advise one baby aspirin daily. Continue heart healthy diet and exercise.  2. Dyslipidemia - Intolerant to statins, zetia per his report. Currently on fish oil 2400 mg daily. Lipid panel done by PCP, will request records. Consider PCSK9 agent pending results.  3. OSA - Followed by PCP  Recommended heart healthy diet and exercise. He has a good weight lifting and stretching regimen - recommend he add some aerobic exercise like walking or the treadmill.  COVID-19 Education: The signs and symptoms of COVID-19 were discussed with the patient and how to seek care for testing (follow up with PCP or arrange E-visit).  The importance of social distancing was discussed today.  Patient Risk:   After full review of this patients clinical status, I feel that they are at least moderate risk at this time.  Time:   Today, I have spent 20 minutes with the patient with telehealth  technology discussing pt health issues.  I spent 5minutes reviewing his chart before the visit.  Visit was finished at 1606.    Medication Adjustments/Labs and Tests Ordered: Current medicines are reviewed at length with the patient today.  Concerns regarding medicines are outlined above.  No orders of the defined types were placed in this encounter.  Medication changes: No orders of the defined types were placed in this encounter.   Disposition:  Follow up in 6 months. Obtain record of recent lipid panel from PCP.  Signed, Georgeanna Leaobert J. Krasowski, MD, [facility] 07/21/2018 3:44 PM    [facility]

## 2018-08-31 ENCOUNTER — Telehealth: Payer: Self-pay | Admitting: Emergency Medicine

## 2018-08-31 NOTE — Telephone Encounter (Signed)
Left message for patient to return call regarding lab results.  

## 2018-09-02 NOTE — Telephone Encounter (Signed)
Patient returned call I informed him of lab results. He is not taking Zetia. Will inform Dr. Fraser Din.

## 2018-12-21 DIAGNOSIS — M7542 Impingement syndrome of left shoulder: Secondary | ICD-10-CM | POA: Diagnosis not present

## 2018-12-21 DIAGNOSIS — M75102 Unspecified rotator cuff tear or rupture of left shoulder, not specified as traumatic: Secondary | ICD-10-CM | POA: Diagnosis not present

## 2018-12-29 DIAGNOSIS — M25612 Stiffness of left shoulder, not elsewhere classified: Secondary | ICD-10-CM | POA: Diagnosis not present

## 2018-12-29 DIAGNOSIS — M25512 Pain in left shoulder: Secondary | ICD-10-CM | POA: Diagnosis not present

## 2019-07-11 ENCOUNTER — Encounter: Payer: Self-pay | Admitting: Cardiology

## 2019-07-11 ENCOUNTER — Ambulatory Visit (INDEPENDENT_AMBULATORY_CARE_PROVIDER_SITE_OTHER): Payer: BC Managed Care – PPO | Admitting: Cardiology

## 2019-07-11 ENCOUNTER — Other Ambulatory Visit: Payer: Self-pay

## 2019-07-11 VITALS — BP 136/90 | HR 70 | Ht 73.0 in | Wt 205.0 lb

## 2019-07-11 DIAGNOSIS — Z9989 Dependence on other enabling machines and devices: Secondary | ICD-10-CM

## 2019-07-11 DIAGNOSIS — I2510 Atherosclerotic heart disease of native coronary artery without angina pectoris: Secondary | ICD-10-CM | POA: Diagnosis not present

## 2019-07-11 DIAGNOSIS — E785 Hyperlipidemia, unspecified: Secondary | ICD-10-CM

## 2019-07-11 DIAGNOSIS — G4733 Obstructive sleep apnea (adult) (pediatric): Secondary | ICD-10-CM | POA: Diagnosis not present

## 2019-07-11 NOTE — Patient Instructions (Addendum)
Medication Instructions:  Your physician recommends that you continue on your current medications as directed. Please refer to the Current Medication list given to you today.  *If you need a refill on your cardiac medications before your next appointment, please call your pharmacy*   Lab Work: None ordered  If you have labs (blood work) drawn today and your tests are completely normal, you will receive your results only by: Marland Kitchen MyChart Message (if you have MyChart) OR . A paper copy in the mail If you have any lab test that is abnormal or we need to change your treatment, we will call you to review the results.   Testing/Procedures: None ordered   You have been referred to see Dr. Rennis Golden in the Lipid Clinic.     Follow-Up: At [facility], you and your health needs are our priority.  As part of our continuing mission to provide you with exceptional heart care, we have created designated Provider Care Teams.  These Care Teams include your primary Cardiologist (physician) and Advanced Practice Providers (APPs -  Physician Assistants and Nurse Practitioners) who all work together to provide you with the care you need, when you need it.  We recommend signing up for the patient portal called "MyChart".  Sign up information is provided on this After Visit Summary.  MyChart is used to connect with patients for Virtual Visits (Telemedicine).  Patients are able to view lab/test results, encounter notes, upcoming appointments, etc.  Non-urgent messages can be sent to your provider as well.   To learn more about what you can do with MyChart, go to [email or web address].    Your next appointment:   6 month(s)  The format for your next appointment:   In Person  Provider:   Gypsy Balsam, MD

## 2019-07-11 NOTE — Progress Notes (Signed)
Cardiology Office Note:    Date:  07/11/2019   ID:  Jeremiah Dickson, DOB Feb 04, 1956, MRN [id number]  PCP:  Noni Saupe, MD  Cardiologist:  Gypsy Balsam, MD    Referring MD: Noni Saupe, MD   Chief Complaint  Patient presents with  . Follow-up    1 Year  Doing well  History of Present Illness:    Jeremiah Dickson is a 64 y.o. male past medical history significant for coronary artery disease.  In 2015 he had cardiac catheterization which showed 30 to 50% blockages in multiple arteries.  He also have dyslipidemia with tolerance to statin as well as Zetia, comes today to my office for follow-up.  Overall doing well.  Denies have any symptoms.  Still exercise aggressively and have no difficulty doing it.  There is no chest pain, tightness, pressure, burning in the chest.  He is dealing with a lot of stress.  And he is trying to cope with that.  Past Medical History:  Diagnosis Date  . Allergic rhinitis   . Arthritis   . Arthritis   . Hypercholesteremia   . Hyperlipidemia   . Insomnia, persistent 01/26/2014  . MVP (mitral valve prolapse)    H/O  . OSA on CPAP 05/23/2014    Past Surgical History:  Procedure Laterality Date  . neck fusion surgery     Dr, Rubye Beach NS 02/2016  . TONSILLECTOMY AND ADENOIDECTOMY  1985    Current Medications: Current Meds  Medication Sig  . cholecalciferol (VITAMIN D) 1000 UNITS tablet Take 2,000 Units by mouth daily.  . Coenzyme Q10 (COQ10 PO) Take by mouth.  Marland Kitchen ibuprofen (ADVIL,MOTRIN) 400 MG tablet Take 400 mg by mouth every 6 (six) hours as needed (Takes when plays tennis (3 x week)).  . Multiple Vitamins-Minerals (MULTIVITAMIN WITH MINERALS) tablet Take 1 tablet by mouth daily.  . Omega-3 Fatty Acids (FISH OIL) 1200 MG CAPS Take 2 capsules by mouth daily.   . sildenafil (VIAGRA) 100 MG tablet Take 100 mg by mouth daily as needed for erectile dysfunction (1/2 - 1 tablet daily prn).  . venlafaxine XR  (EFFEXOR-XR) 150 MG 24 hr capsule Take 150 mg by mouth daily.     Allergies:   Ace inhibitors, Lipitor [atorvastatin], Pravastatin sodium, and Welchol [colesevelam hcl]   Social History   Socioeconomic History  . Marital status: Married    Spouse name: Not on file  . Number of children: Not on file  . Years of education: Not on file  . Highest education level: Not on file  Occupational History  . Not on file  Tobacco Use  . Smoking status: Never Smoker  . Smokeless tobacco: Never Used  Substance and Sexual Activity  . Alcohol use: Yes    Alcohol/week: 0.0 standard drinks    Comment: 3-4 wine glasses a week  . Drug use: No  . Sexual activity: Not on file  Other Topics Concern  . Not on file  Social History Narrative   Right handed. Married, 3 kids, College grad, Caffeine 2 cups daily   Social Determinants of Health   Financial Resource Strain:   . Difficulty of Paying Living Expenses:   Food Insecurity:   . Worried About Programme researcher, broadcasting/film/video in the Last Year:   . Barista in the Last Year:   Transportation Needs:   . Freight forwarder (Medical):   Marland Kitchen Lack of Transportation (Non-Medical):  Physical Activity:   . Days of Exercise per Week:   . Minutes of Exercise per Session:   Stress:   . Feeling of Stress :   Social Connections:   . Frequency of Communication with Friends and Family:   . Frequency of Social Gatherings with Friends and Family:   . Attends Religious Services:   . Active Member of Clubs or Organizations:   . Attends Archivist Meetings:   Marland Kitchen Marital Status:      Family History: The patient's family history includes Arthritis in his father and mother; Cancer in his father, maternal grandfather, maternal grandmother, mother, and paternal grandmother; Heart disease in his paternal grandfather. ROS:   Please see the history of present illness.    All 14 point review of systems negative except as described per history of present  illness  EKGs/Labs/Other Studies Reviewed:      Recent Labs: No results found for requested labs within last 8760 hours.  Recent Lipid Panel No results found for: CHOL, TRIG, HDL, CHOLHDL, VLDL, LDLCALC, LDLDIRECT  Physical Exam:    VS:  BP 136/90   Pulse 70   Ht 6\' 1"  (1.854 m)   Wt 205 lb (93 kg)   SpO2 97%   BMI 27.05 kg/m     Wt Readings from Last 3 Encounters:  07/11/19 205 lb (93 kg)  01/05/18 188 lb 3.2 oz (85.4 kg)  08/24/16 208 lb 3.2 oz (94.4 kg)     GEN:  Well nourished, well developed in no acute distress HEENT: Normal NECK: No JVD; No carotid bruits LYMPHATICS: No lymphadenopathy CARDIAC: RRR, no murmurs, no rubs, no gallops RESPIRATORY:  Clear to auscultation without rales, wheezing or rhonchi  ABDOMEN: Soft, non-tender, non-distended MUSCULOSKELETAL:  No edema; No deformity  SKIN: Warm and dry LOWER EXTREMITIES: no swelling NEUROLOGIC:  Alert and oriented x 3 PSYCHIATRIC:  Normal affect   ASSESSMENT:    1. Coronary artery disease involving native coronary artery of native heart without angina pectoris   2. Dyslipidemia   3. OSA on CPAP    PLAN:    In order of problems listed above:  1. Coronary disease 30 to 50% blockage multiple coronary artery cardiac catheterization 2015.  Asymptomatic but still has not modified risk factors.  Namely dyslipidemia.  He got intolerance to multiple statin.  I think we reached the point that PCSK9 agent should be considered.  I will refer him to our lipid clinic. 2. Dyslipidemia plan as outlined above.  Will do proceed with referral to lipid clinic for PCSK9 agent. 3. Obstructive sleep apnea doing well from that point review. 4. Risk factors modification we spent a great of time talking about need to exercise as well as good diet he is very conscious of it and doing the right thing.  He does not smoke.  He drinks about 1 glass of wine every   Medication Adjustments/Labs and Tests Ordered: Current medicines are  reviewed at length with the patient today.  Concerns regarding medicines are outlined above.  Orders Placed This Encounter  Procedures  . AMB Referral to [facility]  . EKG 12-Lead   Medication changes: No orders of the defined types were placed in this encounter.   Signed, Park Liter, MD, [facility] 07/11/2019 12:43 PM    [place]

## 2019-10-03 ENCOUNTER — Encounter: Payer: Self-pay | Admitting: Internal Medicine

## 2019-10-03 ENCOUNTER — Ambulatory Visit (INDEPENDENT_AMBULATORY_CARE_PROVIDER_SITE_OTHER): Payer: BC Managed Care – PPO | Admitting: Internal Medicine

## 2019-10-03 ENCOUNTER — Other Ambulatory Visit: Payer: Self-pay

## 2019-10-03 VITALS — BP 128/82 | HR 77 | Ht 73.0 in | Wt 212.8 lb

## 2019-10-03 DIAGNOSIS — T466X5A Adverse effect of antihyperlipidemic and antiarteriosclerotic drugs, initial encounter: Secondary | ICD-10-CM

## 2019-10-03 DIAGNOSIS — I2510 Atherosclerotic heart disease of native coronary artery without angina pectoris: Secondary | ICD-10-CM | POA: Diagnosis not present

## 2019-10-03 DIAGNOSIS — M791 Myalgia, unspecified site: Secondary | ICD-10-CM

## 2019-10-03 DIAGNOSIS — E785 Hyperlipidemia, unspecified: Secondary | ICD-10-CM

## 2019-10-03 LAB — LIPID PANEL
Chol/HDL Ratio: 3.9 ratio (ref 0.0–5.0)
Cholesterol, Total: 234 mg/dL — ABNORMAL HIGH (ref 100–199)
HDL: 60 mg/dL (ref >39)
LDL Chol Calc (NIH): 160 mg/dL — ABNORMAL HIGH (ref 0–99)
Triglycerides: 83 mg/dL (ref 0–149)
VLDL Cholesterol Cal: 14 mg/dL (ref 5–40)

## 2019-10-03 MED ORDER — REPATHA SURECLICK 140 MG/ML ~~LOC~~ SOAJ: 1.0000 | SUBCUTANEOUS | 3 refills | Status: DC

## 2019-10-03 NOTE — Patient Instructions (Signed)
Medication Instructions:  Dr. Rennis Golden recommends Repatha 140mg /mL (PCSK9). This is an injectable cholesterol medication self-administered once every 14 days. This medication will need prior approval with your insurance company, which we will work on. If the medication is not approved initially, we may need to do an appeal with your insurance. We will keep you updated on this process.   Administer medication in area of fatty tissue such as abdomen, outer thigh, back up of arm - and rotate site with each injection Store medication in refrigerator until ready to administer - allow to sit at room temp for 30 mins - 1 hour prior to injection Dispose of medication in a SHARPS container - your pharmacy should be able to direct you on this and proper disposal   If you need co-pay assistance, please look into the program at [email or web address] >> disease funds >> hypercholesterolemia. This is an online application or you can call to complete.   If you need a co-pay card for Repatha: >> paying for Repatha If you need a co-pay card for Praluent: [email or web address] >> starting & paying for Praluent  *If you need a refill on your cardiac medications before your next appointment, please call your pharmacy*   Lab Work: FASTING LIPID PANEL in 3-4 months.  Please have fasting blood work about 1 week prior to your next appointment   If you have labs (blood work) drawn today and your tests are completely normal, you will receive your results only by: [email or web address] MyChart Message (if you have MyChart) OR . A paper copy in the mail If you have any lab test that is abnormal or we need to change your treatment, we will call you to review the results.   Testing/Procedures: NONE   Follow-Up: At [facility], you and your health needs are our priority.  As part of our continuing mission to provide you with exceptional heart care, we have created designated Provider Care Teams.  These Care Teams include your  primary Cardiologist (physician) and Advanced Practice Providers (APPs -  Physician Assistants and Nurse Practitioners) who all work together to provide you with the care you need, when you need it.  We recommend signing up for the patient portal called "MyChart".  Sign up information is provided on this After Visit Summary.  MyChart is used to connect with patients for Virtual Visits (Telemedicine).  Patients are able to view lab/test results, encounter notes, upcoming appointments, etc.  Non-urgent messages can be sent to your provider as well.   To learn more about what you can do with MyChart, go to [facility].    Your next appointment:   3-4 month(s)  The format for your next appointment:   Either In Person or Virtual  Provider:   [email or web address] Kirtland Bouchard Hilty, MD   Other Instructions

## 2019-10-03 NOTE — Progress Notes (Signed)
LIPID CLINIC CONSULT NOTE  Chief Complaint:  Statin intolerance  Primary Care Physician: Noni Saupe, MD  Primary Cardiologist:  No primary care provider on file.  HPI:  Jeremiah Dickson is a 64 y.o. male who is being seen today for the evaluation of statin intolerance at the request of Georgeanna Lea, MD.  Jeremiah Dickson is a pleasant 64 year old male who was kindly referred by Dr. Kirtland Bouchard for evaluation management of statin intolerance.  He has a history of coronary artery disease by cardiac catheterization in 2015.  He underwent coronary evaluation after apparently a musculoskeletal injury to his chest while playing tennis.  He went to the emergency department had stress testing other work-up which was negative.  Based on some recurrent symptoms he then underwent left heart catheterization which showed mild to moderate nonobstructive multivessel coronary artery disease.  He reports no significant coronary disease in his family to his knowledge although his mother died of cancer in her 67s.  His father side does not seem to have any significant heart disease.  His most recent lipids were last year that I could find which showed an LDL cholesterol 143.  Unfortunately his been intolerant to a number of statins including atorvastatin, pravastatin and simvastatin.  He also had been on WelChol without benefit.  Additionally I believe he had been tried on ezetimibe.  He is currently taking fish oil over-the-counter.  He is referred for evaluation of PCSK9 inhibitor therapy.  He reports a healthy diet and again is very active plays tennis regularly and is of appropriate weight.  PMHx:  Past Medical History:  Diagnosis Date  . Allergic rhinitis   . Arthritis   . Arthritis   . Hypercholesteremia   . Hyperlipidemia   . Insomnia, persistent 01/26/2014  . MVP (mitral valve prolapse)    H/O  . OSA on CPAP 05/23/2014    Past Surgical History:  Procedure Laterality Date  . neck fusion  surgery     Dr, Rubye Beach NS 02/2016  . TONSILLECTOMY AND ADENOIDECTOMY  1985    FAMHx:  Family History  Problem Relation Age of Onset  . Arthritis Mother   . Cancer Mother   . Arthritis Father   . Cancer Father   . Cancer Maternal Grandmother   . Cancer Paternal Grandmother   . Cancer Maternal Grandfather   . Heart disease Paternal Grandfather     SOCHx:   reports that he has never smoked. He has never used smokeless tobacco. He reports current alcohol use. He reports that he does not use drugs.  ALLERGIES:  Allergies  Allergen Reactions  . Ace Inhibitors Cough  . Lipitor [Atorvastatin]   . Pravastatin Sodium   . Welchol [Colesevelam Hcl]     ROS: Pertinent items noted in HPI and remainder of comprehensive ROS otherwise negative.  HOME MEDS: Current Outpatient Medications on File Prior to Visit  Medication Sig Dispense Refill  . cholecalciferol (VITAMIN D) 1000 UNITS tablet Take 2,000 Units by mouth daily.    . Coenzyme Q10 (COQ10 PO) Take by mouth.    Marland Kitchen ibuprofen (ADVIL,MOTRIN) 400 MG tablet Take 400 mg by mouth every 6 (six) hours as needed (Takes when plays tennis (3 x week)).    . Multiple Vitamins-Minerals (MULTIVITAMIN WITH MINERALS) tablet Take 1 tablet by mouth daily.    . Omega-3 Fatty Acids (FISH OIL) 1200 MG CAPS Take 2 capsules by mouth daily.     . sildenafil (VIAGRA) 100  MG tablet Take 100 mg by mouth daily as needed for erectile dysfunction (1/2 - 1 tablet daily prn).    . venlafaxine XR (EFFEXOR-XR) 150 MG 24 hr capsule Take 150 mg by mouth daily.     No current facility-administered medications on file prior to visit.    LABS/IMAGING: No results found for this or any previous visit (from the past 48 hour(s)). No results found.  LIPID PANEL: No results found for: CHOL, TRIG, HDL, CHOLHDL, VLDL, LDLCALC, LDLDIRECT  WEIGHTS: Wt Readings from Last 3 Encounters:  10/03/19 212 lb 12.8 oz (96.5 kg)  07/11/19 205 lb (93 kg)  01/05/18 188 lb  3.2 oz (85.4 kg)    VITALS: BP 128/82   Pulse 77   Ht 6\' 1"  (1.854 m)   Wt 212 lb 12.8 oz (96.5 kg)   SpO2 97%   BMI 28.08 kg/m   EXAM: General appearance: alert and no distress Neck: no carotid bruit, no JVD and thyroid not enlarged, symmetric, no tenderness/mass/nodules Lungs: clear to auscultation bilaterally Heart: regular rate and rhythm, S1, S2 normal, no murmur, click, rub or gallop Abdomen: soft, non-tender; bowel sounds normal; no masses,  no organomegaly Extremities: extremities normal, atraumatic, no cyanosis or edema Pulses: 2+ and symmetric Skin: Skin color, texture, turgor normal. No rashes or lesions Neurologic: Grossly normal Psych: Pleasant  EKG: Deferred  ASSESSMENT: 1. Mild to moderate nonobstructive multivessel coronary disease (left heart cath 2015) 2. Mixed dyslipidemia 3. Statin intolerance-myalgias  PLAN: 1.   [name] has known multivessel coronary disease by left heart cath in 2015.  He is well controlled hypertension.  His cholesterol is above target.  I would recommend a goal LDL less than 70.  Unfortunately his been intolerant to multiple statins causing myalgias and therefore is a candidate for PCSK9 inhibitor.  He needs more than 50% reduction in LDL cholesterol.  I would recommend Repatha 140 mg every 2 weeks.  I will repeat his labs today to update them and will get prior authorization for the medication.  Plan repeat labs again in about 3 months after therapy and follow-up with me afterwards.  Thanks as always for the kind referral.  2016, MD, Chrystie Nose  [place]  [facility]  Medical Director of the Advanced Lipid Disorders &  Cardiovascular Risk Reduction Clinic Diplomate of the American Board of Clinical Lipidology Attending Cardiologist  Direct Dial: [phone]  Fax: [phone]  Website:  [email or web address] 10/03/2019, 8:49 AM

## 2019-10-04 ENCOUNTER — Telehealth: Payer: Self-pay | Admitting: Internal Medicine

## 2019-10-04 NOTE — Telephone Encounter (Signed)
PA for repatha sureclick submitted via CMM (Key: [id number])

## 2019-10-06 NOTE — Telephone Encounter (Signed)
Follow up   [facility] is calling to check to see if per prior message if the repatha prior auth has been approved. Please call.

## 2019-10-09 NOTE — Telephone Encounter (Signed)
Letter of consent form will be emailed to patient for him to sign/return

## 2019-10-10 NOTE — Telephone Encounter (Signed)
Appeal submitted via CMM  

## 2019-10-18 ENCOUNTER — Telehealth: Payer: Self-pay | Admitting: Internal Medicine

## 2019-10-18 NOTE — Telephone Encounter (Signed)
Notified [facility] that appeal has been resubmitted

## 2019-10-18 NOTE — Telephone Encounter (Signed)
Appeal re-submitted via fax to [facility] @ [phone]

## 2019-10-18 NOTE — Telephone Encounter (Signed)
This appeal was submitted via CMM and the request form signed by patient was attached. Also received mailed correspondence from insurance company that this was not received - may be error with CMM format.   I have faxed appeal request to [facility] - Appeals Dept Level I @ [phone] on 10/18/2019

## 2019-10-18 NOTE — Telephone Encounter (Signed)
Pt c/o medication issue:  1. Name of Medication: Evolocumab (REPATHA SURECLICK) 140 MG/ML SOAJ  2. How are you currently taking this medication (dosage and times per day)?   3. Are you having a reaction (difficulty breathing--STAT)?   4. What is your medication issue? [facility] is calling stating the appeal has not yet been approved for this medication because it was sent without the request form from the patient for the appeal.  Please advise.

## 2019-10-31 ENCOUNTER — Telehealth: Payer: Self-pay | Admitting: Internal Medicine

## 2019-10-31 NOTE — Telephone Encounter (Signed)
Attempted to call [employer] provider appeals @ [phone] and the are closed for a company event

## 2019-10-31 NOTE — Telephone Encounter (Signed)
LM for patient that email was received, appeal was submitted, I tried to call to check on status but [employer] appeal dept is closed today and that I will check tomorrow

## 2019-10-31 NOTE — Telephone Encounter (Signed)
    Pt said [place] emailed him but he couldn't read it, he also said he emailed her back and would like to know if she receive it.

## 2019-11-01 NOTE — Telephone Encounter (Signed)
Contacted [employer] and they do not have the correct appeals forms. Explained that I completed what I was provided with and requested that rep please send over the member consent form and provider form to be completed

## 2019-11-01 NOTE — Telephone Encounter (Signed)
Email sent to patient with form that needs to be completed/signed by him so appeal can be processed

## 2019-11-06 NOTE — Telephone Encounter (Signed)
Faxed additional appeal info to [facility] @ [phone]

## 2019-11-09 NOTE — Telephone Encounter (Signed)
Follow Up  Jeremiah Dickson from [employer] is calling in to get more information about the Repatha. Appeal was received, but they need more info. Please call and assist.

## 2019-11-09 NOTE — Telephone Encounter (Signed)
Spoke with CMM rep and explained that additional appeal info was faxed on 11/06/19 to [facility] phone #: [phone]

## 2019-11-13 NOTE — Telephone Encounter (Signed)
Left message for patient with an update on appeal

## 2019-11-13 NOTE — Telephone Encounter (Signed)
Checked on status of appeal - 23 calendar days are left on pending appeal approval. Can take up to 30 calendar days.

## 2019-12-04 NOTE — Telephone Encounter (Addendum)
[employer] has approved Repatha from 11/23/2019 - 11/22/2020 Patient notified Rescheduled lipid clinic visit to 02/29/2020

## 2020-01-09 DIAGNOSIS — R0789 Other chest pain: Secondary | ICD-10-CM | POA: Diagnosis not present

## 2020-01-10 DIAGNOSIS — E7800 Pure hypercholesterolemia, unspecified: Secondary | ICD-10-CM | POA: Insufficient documentation

## 2020-01-10 DIAGNOSIS — E785 Hyperlipidemia, unspecified: Secondary | ICD-10-CM | POA: Insufficient documentation

## 2020-01-10 DIAGNOSIS — I341 Nonrheumatic mitral (valve) prolapse: Secondary | ICD-10-CM | POA: Insufficient documentation

## 2020-01-10 DIAGNOSIS — M1990 Unspecified osteoarthritis, unspecified site: Secondary | ICD-10-CM | POA: Insufficient documentation

## 2020-01-10 DIAGNOSIS — J309 Allergic rhinitis, unspecified: Secondary | ICD-10-CM | POA: Insufficient documentation

## 2020-01-15 ENCOUNTER — Ambulatory Visit: Payer: BC Managed Care – PPO | Admitting: Cardiology

## 2020-01-29 DIAGNOSIS — Z0000 Encounter for general adult medical examination without abnormal findings: Secondary | ICD-10-CM | POA: Diagnosis not present

## 2020-01-29 DIAGNOSIS — Z125 Encounter for screening for malignant neoplasm of prostate: Secondary | ICD-10-CM | POA: Diagnosis not present

## 2020-01-29 DIAGNOSIS — Z23 Encounter for immunization: Secondary | ICD-10-CM | POA: Diagnosis not present

## 2020-01-29 DIAGNOSIS — Z6828 Body mass index (BMI) 28.0-28.9, adult: Secondary | ICD-10-CM | POA: Diagnosis not present

## 2020-01-29 DIAGNOSIS — Z1331 Encounter for screening for depression: Secondary | ICD-10-CM | POA: Diagnosis not present

## 2020-02-08 ENCOUNTER — Ambulatory Visit: Payer: BC Managed Care – PPO | Admitting: Internal Medicine

## 2020-02-13 ENCOUNTER — Ambulatory Visit: Payer: BC Managed Care – PPO | Admitting: Cardiology

## 2020-02-20 DIAGNOSIS — E785 Hyperlipidemia, unspecified: Secondary | ICD-10-CM | POA: Diagnosis not present

## 2020-02-20 LAB — LIPID PANEL
Chol/HDL Ratio: 2.5 ratio (ref 0.0–5.0)
Cholesterol, Total: 169 mg/dL (ref 100–199)
HDL: 67 mg/dL (ref >39)
LDL Chol Calc (NIH): 84 mg/dL (ref 0–99)
Triglycerides: 98 mg/dL (ref 0–149)
VLDL Cholesterol Cal: 18 mg/dL (ref 5–40)

## 2020-02-29 ENCOUNTER — Ambulatory Visit (INDEPENDENT_AMBULATORY_CARE_PROVIDER_SITE_OTHER): Payer: BC Managed Care – PPO | Admitting: Internal Medicine

## 2020-02-29 ENCOUNTER — Other Ambulatory Visit: Payer: Self-pay

## 2020-02-29 ENCOUNTER — Encounter: Payer: Self-pay | Admitting: Internal Medicine

## 2020-02-29 VITALS — BP 146/80 | HR 70 | Ht 72.0 in | Wt 215.8 lb

## 2020-02-29 DIAGNOSIS — T466X5A Adverse effect of antihyperlipidemic and antiarteriosclerotic drugs, initial encounter: Secondary | ICD-10-CM

## 2020-02-29 DIAGNOSIS — M791 Myalgia, unspecified site: Secondary | ICD-10-CM

## 2020-02-29 DIAGNOSIS — I2510 Atherosclerotic heart disease of native coronary artery without angina pectoris: Secondary | ICD-10-CM | POA: Diagnosis not present

## 2020-02-29 DIAGNOSIS — E785 Hyperlipidemia, unspecified: Secondary | ICD-10-CM | POA: Diagnosis not present

## 2020-02-29 NOTE — Patient Instructions (Signed)
Medication Instructions:  Your physician recommends that you continue on your current medications as directed. Please refer to the Current Medication list given to you today.  *If you need a refill on your cardiac medications before your next appointment, please call your pharmacy*   Lab Work: FASTING LIPID PANEL in 6 months  If you have labs (blood work) drawn today and your tests are completely normal, you will receive your results only by: Marland Kitchen MyChart Message (if you have MyChart) OR . A paper copy in the mail If you have any lab test that is abnormal or we need to change your treatment, we will call you to review the results.   Testing/Procedures: NONE   Follow-Up: At [facility], you and your health needs are our priority.  As part of our continuing mission to provide you with exceptional heart care, we have created designated Provider Care Teams.  These Care Teams include your primary Cardiologist (physician) and Advanced Practice Providers (APPs -  Physician Assistants and Nurse Practitioners) who all work together to provide you with the care you need, when you need it.  We recommend signing up for the patient portal called "MyChart".  Sign up information is provided on this After Visit Summary.  MyChart is used to connect with patients for Virtual Visits (Telemedicine).  Patients are able to view lab/test results, encounter notes, upcoming appointments, etc.  Non-urgent messages can be sent to your provider as well.   To learn more about what you can do with MyChart, go to [email or web address].    Your next appointment:   12 month(s)  The format for your next appointment:   In Person  Provider:    Dr. Rennis Golden - lipid clinic    Other Instructions

## 2020-02-29 NOTE — Progress Notes (Signed)
LIPID CLINIC CONSULT NOTE  Chief Complaint:  Statin intolerance  Primary Care Physician: Noni Saupe, MD  Primary Cardiologist:  No primary care provider on file.  HPI:  Jeremiah Dickson is a 64 y.o. male who is being seen today for the evaluation of statin intolerance at the request of Jeanie Sewer, Valrie Hart, MD.  Jeremiah Dickson is a pleasant 64 year old male who was kindly referred by Dr. Kirtland Bouchard for evaluation management of statin intolerance.  He has a history of coronary artery disease by cardiac catheterization in 2015.  He underwent coronary evaluation after apparently a musculoskeletal injury to his chest while playing tennis.  He went to the emergency department had stress testing other work-up which was negative.  Based on some recurrent symptoms he then underwent left heart catheterization which showed mild to moderate nonobstructive multivessel coronary artery disease.  He reports no significant coronary disease in his family to his knowledge although his mother died of cancer in her 38s.  His father side does not seem to have any significant heart disease.  His most recent lipids were last year that I could find which showed an LDL cholesterol 143.  Unfortunately his been intolerant to a number of statins including atorvastatin, pravastatin and simvastatin.  He also had been on WelChol without benefit.  Additionally I believe he had been tried on ezetimibe.  He is currently taking fish oil over-the-counter.  He is referred for evaluation of PCSK9 inhibitor therapy.  He reports a healthy diet and again is very active plays tennis regularly and is of appropriate weight.   02/29/2020  Jeremiah Dickson returns today for follow-up.  Overall he is doing well and tolerating the Repatha.  He has had marked reduction in his lipids.  Total cholesterol is declined from 234 down to 169, triglycerides 98, HDL 67 and LDL of 84 (reduced from 160).  Moreover he is tolerating medicine very well.  He is not  having the knee pain and symptoms that he had previously with the statins and other medications.  Although he still remains above a target LDL of 70, he has had a 50% reduction in his lipids.  He is also is exercising more now and working to lose some weight.  It is very possible he will reach his target with dietary modifications.  PMHx:  Past Medical History:  Diagnosis Date  . Allergic rhinitis   . Arthritis   . Arthritis   . Coronary artery disease involving native coronary artery of native heart without angina pectoris 07/10/2015   Overview:  30-50% blockages in multiple coronary artery based on catheterization 2015  . Dyslipidemia 07/10/2015   Overview:  With intolerance to multiple statins  . Hypercholesteremia   . Hyperlipidemia   . Insomnia, persistent 01/26/2014  . MVP (mitral valve prolapse)    H/O  . Neck pain on left side 02/28/2013   msk respond to osteopathic manipulation.   . Nonallopathic lesion of cervical region 02/28/2013  . Nonallopathic lesion of lumbosacral region 02/28/2013  . Nonallopathic lesion of thoracic region 02/28/2013  . OSA on CPAP 05/23/2014    Past Surgical History:  Procedure Laterality Date  . neck fusion surgery     Dr, Rubye Beach NS 02/2016  . TONSILLECTOMY AND ADENOIDECTOMY  1985    FAMHx:  Family History  Problem Relation Age of Onset  . Arthritis Mother   . Cancer Mother   . Arthritis Father   . Cancer Father   .  Cancer Maternal Grandmother   . Cancer Paternal Grandmother   . Cancer Maternal Grandfather   . Heart disease Paternal Grandfather     SOCHx:   reports that he has never smoked. He has never used smokeless tobacco. He reports current alcohol use. He reports that he does not use drugs.  ALLERGIES:  Allergies  Allergen Reactions  . Ace Inhibitors Cough  . Lipitor [Atorvastatin]   . Pravastatin Sodium   . Welchol [Colesevelam Hcl]     ROS: Pertinent items noted in HPI and remainder of comprehensive ROS otherwise  negative.  HOME MEDS: Current Outpatient Medications on File Prior to Visit  Medication Sig Dispense Refill  . cholecalciferol (VITAMIN D) 1000 UNITS tablet Take 2,000 Units by mouth daily.    . Coenzyme Q10 (COQ10 PO) Take by mouth.    . Evolocumab (REPATHA SURECLICK) 140 MG/ML SOAJ Inject 1 Dose into the skin every 14 (fourteen) days. 6 pen 3  . ibuprofen (ADVIL,MOTRIN) 400 MG tablet Take 400 mg by mouth every 6 (six) hours as needed (Takes when plays tennis (3 x week)).    . Multiple Vitamins-Minerals (MULTIVITAMIN WITH MINERALS) tablet Take 1 tablet by mouth daily.    . Omega-3 Fatty Acids (FISH OIL) 1200 MG CAPS Take 2 capsules by mouth daily.     . sildenafil (VIAGRA) 100 MG tablet Take 100 mg by mouth daily as needed for erectile dysfunction (1/2 - 1 tablet daily prn).    . venlafaxine XR (EFFEXOR-XR) 150 MG 24 hr capsule Take 150 mg by mouth daily.     No current facility-administered medications on file prior to visit.    LABS/IMAGING: No results found for this or any previous visit (from the past 48 hour(s)). No results found.  LIPID PANEL:    Component Value Date/Time   CHOL 169 02/20/2020 0900   TRIG 98 02/20/2020 0900   HDL 67 02/20/2020 0900   CHOLHDL 2.5 02/20/2020 0900   LDLCALC 84 02/20/2020 0900    WEIGHTS: Wt Readings from Last 3 Encounters:  02/29/20 215 lb 12.8 oz (97.9 kg)  10/03/19 212 lb 12.8 oz (96.5 kg)  07/11/19 205 lb (93 kg)    VITALS: BP (!) 146/80   Pulse 70   Ht 6' (1.829 m)   Wt 215 lb 12.8 oz (97.9 kg)   BMI 29.27 kg/m   EXAM: Deferred  EKG: Deferred  ASSESSMENT: 1. Mild to moderate nonobstructive multivessel coronary disease (left heart cath 2015) 2. Mixed dyslipidemia 3. Statin intolerance-myalgias  PLAN: 1.   Jeremiah Dickson has had significant improvement in his dyslipidemia on Repatha and it is very well-tolerated.  He is having no side effects that he is aware of.  We discussed other possible options however he had not been  able to tolerate ezetimibe in the past and statins at pretty much any dose were intolerable to him.  He like to continue to work on diet and more exercise to see if he can achieve that target.  I would recommend a repeat lipid profile which we have provided an order for today in 6 months.  He does follow-up with his primary cardiologist next week.  Follow-up with me annually or sooner as necessary  Chrystie Nose, MD, [facility], FACP  [place]  [facility]  Medical Director of the Advanced Lipid Disorders &  Cardiovascular Risk Reduction Clinic Diplomate of the American Board of Clinical Lipidology Attending Cardiologist  Direct Dial: [phone]  Fax: [phone]  Website:  [email or web address]  Lisette Abu Hilty 02/29/2020, 8:09 AM

## 2020-03-06 ENCOUNTER — Other Ambulatory Visit: Payer: Self-pay

## 2020-03-06 ENCOUNTER — Ambulatory Visit (INDEPENDENT_AMBULATORY_CARE_PROVIDER_SITE_OTHER): Payer: BC Managed Care – PPO | Admitting: Cardiology

## 2020-03-06 ENCOUNTER — Encounter: Payer: Self-pay | Admitting: Cardiology

## 2020-03-06 VITALS — BP 130/92 | HR 82 | Ht 72.0 in | Wt 219.0 lb

## 2020-03-06 DIAGNOSIS — I2510 Atherosclerotic heart disease of native coronary artery without angina pectoris: Secondary | ICD-10-CM

## 2020-03-06 DIAGNOSIS — E782 Mixed hyperlipidemia: Secondary | ICD-10-CM

## 2020-03-06 NOTE — Progress Notes (Signed)
Cardiology Office Note:    Date:  03/06/2020   ID:  Daphene Jaeger, DOB 08/13/1955, MRN [id number]  PCP:  Noni Saupe, MD  Cardiologist:  Gypsy Balsam, MD    Referring MD: Noni Saupe, MD   Chief Complaint  Patient presents with  . Follow-up  Doing very well  History of Present Illness:    Jeremiah Dickson is a 64 y.o. male with past medical history significant for coronary artery disease in 2015 he did have a cardiac catheterization which showed multiple 30 to 50% blockages in multiple arteries, also recently he did have some atypical chest pain related to muscles however CT of his chest was done and showed calcification of arteries in the heart as well as aorta.  He had intolerance to statin as well as Zetia however he was referred to lipid clinic and he was put on PCSK9 agent tolerating this excellent his cholesterol looks very good.  He is very active exercise on the regular basis have no difficulty doing it.  Past Medical History:  Diagnosis Date  . Allergic rhinitis   . Arthritis   . Arthritis   . Coronary artery disease involving native coronary artery of native heart without angina pectoris 07/10/2015   Overview:  30-50% blockages in multiple coronary artery based on catheterization 2015  . Dyslipidemia 07/10/2015   Overview:  With intolerance to multiple statins  . Hypercholesteremia   . Hyperlipidemia   . Insomnia, persistent 01/26/2014  . MVP (mitral valve prolapse)    H/O  . Neck pain on left side 02/28/2013   msk respond to osteopathic manipulation.   . Nonallopathic lesion of cervical region 02/28/2013  . Nonallopathic lesion of lumbosacral region 02/28/2013  . Nonallopathic lesion of thoracic region 02/28/2013  . OSA on CPAP 05/23/2014    Past Surgical History:  Procedure Laterality Date  . neck fusion surgery     Dr, Rubye Beach NS 02/2016  . TONSILLECTOMY AND ADENOIDECTOMY  1985    Current Medications: Current Meds   Medication Sig  . cholecalciferol (VITAMIN D) 1000 UNITS tablet Take 2,000 Units by mouth daily.  . Coenzyme Q10 (COQ10 PO) Take by mouth.  . Evolocumab (REPATHA SURECLICK) 140 MG/ML SOAJ Inject 1 Dose into the skin every 14 (fourteen) days.  Marland Kitchen ibuprofen (ADVIL,MOTRIN) 400 MG tablet Take 400 mg by mouth every 6 (six) hours as needed (Takes when plays tennis (3 x week)).  . Multiple Vitamins-Minerals (MULTIVITAMIN WITH MINERALS) tablet Take 1 tablet by mouth daily.  . Omega-3 Fatty Acids (FISH OIL) 1200 MG CAPS Take 2 capsules by mouth daily.   . sildenafil (VIAGRA) 100 MG tablet Take 100 mg by mouth daily as needed for erectile dysfunction (1/2 - 1 tablet daily prn).  . venlafaxine XR (EFFEXOR-XR) 150 MG 24 hr capsule Take 150 mg by mouth daily.     Allergies:   Ace inhibitors, Lipitor [atorvastatin], Pravastatin sodium, and Welchol [colesevelam hcl]   Social History   Socioeconomic History  . Marital status: Married    Spouse name: Not on file  . Number of children: Not on file  . Years of education: Not on file  . Highest education level: Not on file  Occupational History  . Not on file  Tobacco Use  . Smoking status: Never Smoker  . Smokeless tobacco: Never Used  Vaping Use  . Vaping Use: Never used  Substance and Sexual Activity  . Alcohol use: Yes  Alcohol/week: 0.0 standard drinks    Comment: 3-4 wine glasses a week  . Drug use: No  . Sexual activity: Not on file  Other Topics Concern  . Not on file  Social History Narrative   Right handed. Married, 3 kids, College grad, Caffeine 2 cups daily   Social Determinants of Health   Financial Resource Strain: Not on file  Food Insecurity: Not on file  Transportation Needs: Not on file  Physical Activity: Not on file  Stress: Not on file  Social Connections: Not on file     Family History: The patient's family history includes Arthritis in his father and mother; Cancer in his father, maternal grandfather, maternal  grandmother, mother, and paternal grandmother; Heart disease in his paternal grandfather. ROS:   Please see the history of present illness.    All 14 point review of systems negative except as described per history of present illness  EKGs/Labs/Other Studies Reviewed:      Recent Labs: No results found for requested labs within last 8760 hours.  Recent Lipid Panel    Component Value Date/Time   CHOL 169 02/20/2020 0900   TRIG 98 02/20/2020 0900   HDL 67 02/20/2020 0900   CHOLHDL 2.5 02/20/2020 0900   LDLCALC 84 02/20/2020 0900    Physical Exam:    VS:  BP (!) 130/92 (BP Location: Left Arm, Patient Position: Sitting)   Pulse 82   Ht 6' (1.829 m)   Wt 219 lb (99.3 kg)   SpO2 93%   BMI 29.70 kg/m     Wt Readings from Last 3 Encounters:  03/06/20 219 lb (99.3 kg)  02/29/20 215 lb 12.8 oz (97.9 kg)  10/03/19 212 lb 12.8 oz (96.5 kg)     GEN:  Well nourished, well developed in no acute distress HEENT: Normal NECK: No JVD; No carotid bruits LYMPHATICS: No lymphadenopathy CARDIAC: RRR, no murmurs, no rubs, no gallops RESPIRATORY:  Clear to auscultation without rales, wheezing or rhonchi  ABDOMEN: Soft, non-tender, non-distended MUSCULOSKELETAL:  No edema; No deformity  SKIN: Warm and dry LOWER EXTREMITIES: no swelling NEUROLOGIC:  Alert and oriented x 3 PSYCHIATRIC:  Normal affect   ASSESSMENT:    1. Coronary artery disease involving native coronary artery of native heart without angina pectoris   2. Mixed hyperlipidemia    PLAN:    In order of problems listed above:  1. Coronary disease no symptoms.  The key is risk factors modifications.  He is on appropriate medications which I will continue. 2. He is on aspirin as well as Repatha. 3. Mixed dyslipidemia I did review his K PN which showed LDL of 84 HDL 67 this is from 30 November of this year.  Much improvement compared to before.  I did review notes by lipid clinic. 4. We did talk about healthy lifestyle need  to exercise on the regular basis which we have milligrams   Medication Adjustments/Labs and Tests Ordered: Current medicines are reviewed at length with the patient today.  Concerns regarding medicines are outlined above.  No orders of the defined types were placed in this encounter.  Medication changes: No orders of the defined types were placed in this encounter.   Signed, Georgeanna Lea, MD, [facility] 03/06/2020 3:38 PM    [facility]

## 2020-03-06 NOTE — Patient Instructions (Signed)

## 2020-06-13 ENCOUNTER — Telehealth: Payer: Self-pay | Admitting: Emergency Medicine

## 2020-06-13 DIAGNOSIS — R079 Chest pain, unspecified: Secondary | ICD-10-CM

## 2020-06-13 DIAGNOSIS — R0789 Other chest pain: Secondary | ICD-10-CM | POA: Diagnosis not present

## 2020-06-13 DIAGNOSIS — R0600 Dyspnea, unspecified: Secondary | ICD-10-CM | POA: Diagnosis not present

## 2020-06-13 DIAGNOSIS — Z6828 Body mass index (BMI) 28.0-28.9, adult: Secondary | ICD-10-CM | POA: Diagnosis not present

## 2020-06-13 NOTE — Telephone Encounter (Signed)
Called patient. Left message for patient to return call. Patient needs appointment next week and appointment for a echo per Dr. Bing Matter.

## 2020-06-24 NOTE — Addendum Note (Signed)
Addended by: Hazle Quant on: 06/24/2020 04:55 PM   Modules accepted: Orders

## 2020-06-24 NOTE — Telephone Encounter (Signed)
Called patient he reports he has been out of the country that is why he hasn't been back in touch with [place]. Scheduled him to see Dr. Bing Matter on April19th that is the soonest the patient can come in. Advised him someone will be in touch to schedule a echo.

## 2020-07-05 ENCOUNTER — Other Ambulatory Visit: Payer: Self-pay

## 2020-07-09 ENCOUNTER — Ambulatory Visit: Payer: BC Managed Care – PPO | Admitting: Cardiology

## 2020-07-19 ENCOUNTER — Other Ambulatory Visit: Payer: Self-pay

## 2020-07-19 ENCOUNTER — Ambulatory Visit (INDEPENDENT_AMBULATORY_CARE_PROVIDER_SITE_OTHER): Payer: BC Managed Care – PPO

## 2020-07-19 DIAGNOSIS — R079 Chest pain, unspecified: Secondary | ICD-10-CM

## 2020-07-19 LAB — ECHOCARDIOGRAM COMPLETE
Area-P 1/2: 4.63 cm2
S' Lateral: 2.4 cm

## 2020-07-19 NOTE — Progress Notes (Signed)
Complete echocardiogram performed.  Jimmy Alexandra Posadas RDCS, RVT  

## 2020-07-22 ENCOUNTER — Telehealth: Payer: Self-pay | Admitting: Cardiology

## 2020-07-22 ENCOUNTER — Telehealth: Payer: Self-pay

## 2020-07-22 NOTE — Telephone Encounter (Signed)
Encounter not needed nurse called patient back

## 2020-07-22 NOTE — Telephone Encounter (Signed)
Patient notified of test results and verbalized understanding.   

## 2020-07-22 NOTE — Telephone Encounter (Signed)
-----   Message from Georgeanna Lea, MD sent at 07/22/2020 11:07 AM EDT ----- Echocardiogram looks good, his left ventricle ejection fraction is normal, there is mild LVH, trivial mitral valve regurgitation, overall looks good

## 2020-09-09 ENCOUNTER — Ambulatory Visit: Payer: BC Managed Care – PPO | Admitting: Cardiology

## 2020-10-22 ENCOUNTER — Other Ambulatory Visit: Payer: Self-pay | Admitting: Internal Medicine

## 2020-12-05 ENCOUNTER — Telehealth: Payer: Self-pay | Admitting: Internal Medicine

## 2020-12-05 NOTE — Telephone Encounter (Signed)
PA for repatha submitted via CMM (Key: [id number])

## 2020-12-10 NOTE — Telephone Encounter (Signed)
Medication approved 12/05/20 - 12/04/2021

## 2021-02-17 ENCOUNTER — Emergency Department (HOSPITAL_COMMUNITY): Payer: BC Managed Care – PPO

## 2021-02-17 ENCOUNTER — Emergency Department (HOSPITAL_COMMUNITY)
Admission: EM | Admit: 2021-02-17 | Discharge: 2021-02-17 | Disposition: A | Discharge status: Home / Self Care | Payer: BC Managed Care – PPO | Attending: Emergency Medicine | Admitting: Emergency Medicine

## 2021-02-17 ENCOUNTER — Encounter (HOSPITAL_COMMUNITY): Payer: Self-pay

## 2021-02-17 ENCOUNTER — Other Ambulatory Visit: Payer: Self-pay

## 2021-02-17 DIAGNOSIS — I2510 Atherosclerotic heart disease of native coronary artery without angina pectoris: Secondary | ICD-10-CM | POA: Insufficient documentation

## 2021-02-17 DIAGNOSIS — I10 Essential (primary) hypertension: Secondary | ICD-10-CM | POA: Insufficient documentation

## 2021-02-17 DIAGNOSIS — R079 Chest pain, unspecified: Secondary | ICD-10-CM

## 2021-02-17 LAB — CBC WITH DIFFERENTIAL/PLATELET
Abs Immature Granulocytes: 0.02 10*3/uL (ref 0.00–0.07)
Basophils Absolute: 0 10*3/uL (ref 0.0–0.1)
Basophils Relative: 1 %
Eosinophils Absolute: 0.2 10*3/uL (ref 0.0–0.5)
Eosinophils Relative: 3 %
HCT: 48.5 % (ref 39.0–52.0)
Hemoglobin: 16.7 g/dL (ref 13.0–17.0)
Immature Granulocytes: 0 %
Lymphocytes Relative: 38 %
Lymphs Abs: 2.5 10*3/uL (ref 0.7–4.0)
MCH: 32.5 pg (ref 26.0–34.0)
MCHC: 34.4 g/dL (ref 30.0–36.0)
MCV: 94.4 fL (ref 80.0–100.0)
Monocytes Absolute: 0.5 10*3/uL (ref 0.1–1.0)
Monocytes Relative: 9 %
Neutro Abs: 3.2 10*3/uL (ref 1.7–7.7)
Neutrophils Relative %: 49 %
Platelets: 216 10*3/uL (ref 150–400)
RBC: 5.14 MIL/uL (ref 4.22–5.81)
RDW: 12.9 % (ref 11.5–15.5)
WBC: 6.4 10*3/uL (ref 4.0–10.5)
nRBC: 0 % (ref 0.0–0.2)

## 2021-02-17 LAB — COMPREHENSIVE METABOLIC PANEL
ALT: 48 U/L — ABNORMAL HIGH (ref 0–44)
AST: 28 U/L (ref 15–41)
Albumin: 4 g/dL (ref 3.5–5.0)
Alkaline Phosphatase: 65 U/L (ref 38–126)
Anion gap: 7 (ref 5–15)
BUN: 14 mg/dL (ref 8–23)
CO2: 24 mmol/L (ref 22–32)
Calcium: 9.8 mg/dL (ref 8.9–10.3)
Chloride: 105 mmol/L (ref 98–111)
Creatinine, Ser: 0.89 mg/dL (ref 0.61–1.24)
GFR, Estimated: >60 mL/min (ref >60)
Glucose, Bld: 105 mg/dL — ABNORMAL HIGH (ref 70–99)
Potassium: 4 mmol/L (ref 3.5–5.1)
Sodium: 136 mmol/L (ref 135–145)
Total Bilirubin: 0.9 mg/dL (ref 0.3–1.2)
Total Protein: 6.8 g/dL (ref 6.5–8.1)

## 2021-02-17 LAB — TROPONIN I (HIGH SENSITIVITY)
Troponin I (High Sensitivity): 3 ng/L (ref <18)
Troponin I (High Sensitivity): 3 ng/L (ref <18)

## 2021-02-17 LAB — D-DIMER, QUANTITATIVE: D-Dimer, Quant: <0.27 ug/mL-FEU (ref 0.00–0.50)

## 2021-02-17 MED ORDER — NAPROXEN 375 MG PO TABS: 375.0000 mg | ORAL_TABLET | Freq: Two times a day (BID) | ORAL | 0 refills | Status: AC

## 2021-02-17 NOTE — ED Provider Notes (Signed)
MSE was initiated and I personally evaluated the patient and placed orders (if any) at  5:20 AM on February 17, 2021.  Patient with history of HTN, HLD, OSA, CAD, presents with left chest pain described as sharp, constant. He returned from vacation last night and started feeling poorly on the way home. No SOB, cough, fever. No vomiting.   Today's Vitals   02/17/21 0513  BP: (!) 132/95  Pulse: 64  Resp: 14  Temp: 98.3 F (36.8 C)  TempSrc: Oral  SpO2: 99%  Weight: 93 kg  Height: 6' (1.829 m)  PainSc: 5    Body mass index is 27.8 kg/m.  RRR No peripheral edema Lungs clear  The patient appears stable so that the remainder of the MSE may be completed by another provider.   Elpidio Anis, PA-C 02/17/21 Eulogio Ditch, MD 02/17/21 [phone]

## 2021-02-17 NOTE — ED Provider Notes (Signed)
[facility] EMERGENCY DEPARTMENT Provider Note   CSN: [id number] Arrival date & time: 02/17/21  0441     History Chief Complaint  Patient presents with   Chest Pain    Jeremiah Dickson is a 65 y.o. male.   Chest Pain  HPI: A 65 year old patient with a history of hypercholesterolemia presents for evaluation of chest pain. Initial onset of pain was more than 6 hours ago. The patient's chest pain is sharp and is not worse with exertion. The patient's chest pain is middle- or left-sided, is not well-localized, is not described as heaviness/pressure/tightness and does not radiate to the arms/jaw/neck. The patient does not complain of nausea and denies diaphoresis. The patient has no history of stroke, has no history of peripheral artery disease, has not smoked in the past 90 days, denies any history of treated diabetes, has no relevant family history of coronary artery disease (first degree relative at less than age 43), is not hypertensive and does not have an elevated BMI (>=30).   Past Medical History:  Diagnosis Date   Allergic rhinitis    Arthritis    Arthritis    Coronary artery disease involving native coronary artery of native heart without angina pectoris 07/10/2015   Overview:  30-50% blockages in multiple coronary artery based on catheterization 2015   Dyslipidemia 07/10/2015   Overview:  With intolerance to multiple statins   Hypercholesteremia    Hyperlipidemia    Insomnia, persistent 01/26/2014   MVP (mitral valve prolapse)    H/O   Neck pain on left side 02/28/2013   msk respond to osteopathic manipulation.    Nonallopathic lesion of cervical region 02/28/2013   Nonallopathic lesion of lumbosacral region 02/28/2013   Nonallopathic lesion of thoracic region 02/28/2013   OSA on CPAP 05/23/2014    Patient Active Problem List   Diagnosis Date Noted   MVP (mitral valve prolapse)    Hyperlipidemia    Hypercholesteremia    Arthritis    Allergic  rhinitis    Dyslipidemia 07/10/2015   Coronary artery disease involving native coronary artery of native heart without angina pectoris 07/10/2015   OSA on CPAP 05/23/2014   Insomnia, persistent 01/26/2014   Neck pain on left side 02/28/2013   Nonallopathic lesion of cervical region 02/28/2013   Nonallopathic lesion of thoracic region 02/28/2013   Nonallopathic lesion of lumbosacral region 02/28/2013    Past Surgical History:  Procedure Laterality Date   neck fusion surgery     Dr, Babs Bertin NS 02/2016   TONSILLECTOMY AND ADENOIDECTOMY  1985       Family History  Problem Relation Age of Onset   Arthritis Mother    Cancer Mother    Arthritis Father    Cancer Father    Cancer Maternal Grandmother    Cancer Paternal Grandmother    Cancer Maternal Grandfather    Heart disease Paternal Grandfather     Social History   Tobacco Use   Smoking status: Never   Smokeless tobacco: Never  Vaping Use   Vaping Use: Never used  Substance Use Topics   Alcohol use: Yes    Alcohol/week: 0.0 standard drinks    Comment: 3-4 wine glasses a week   Drug use: No    Home Medications Prior to Admission medications   Medication Sig Start Date End Date Taking? Authorizing Provider  naproxen (NAPROSYN) 375 MG tablet Take 1 tablet (375 mg total) by mouth 2 (two) times daily. 02/17/21  Yes Tomi Bamberger,  Cletis Athens, MD  albuterol (VENTOLIN HFA) 108 (90 Base) MCG/ACT inhaler Inhale 2 puffs into the lungs as needed for wheezing or shortness of breath. 06/13/20   [provider]  buPROPion (WELLBUTRIN XL) 150 MG 24 hr tablet Take 1 tablet by mouth daily. 06/28/20   [provider]  cholecalciferol (VITAMIN D) 1000 UNITS tablet Take 2,000 Units by mouth daily.    [provider]  Coenzyme Q10 (COQ10 PO) Take by mouth.    [provider]  ibuprofen (ADVIL,MOTRIN) 400 MG tablet Take 400 mg by mouth every 6 (six) hours as needed (Takes when plays tennis (3 x week)).     [provider]  Multiple Vitamins-Minerals (MULTIVITAMIN WITH MINERALS) tablet Take 1 tablet by mouth daily.    [provider]  Omega-3 Fatty Acids (FISH OIL) 1200 MG CAPS Take 2 capsules by mouth daily.     [provider]  REPATHA SURECLICK 140 MG/ML SOAJ INJECT 1 DOSE INTO THE SKIN EVERY 14 DAYS 10/22/20   Hilty, Lisette Abu, MD  sildenafil (VIAGRA) 100 MG tablet Take 100 mg by mouth daily as needed for erectile dysfunction (1/2 - 1 tablet daily prn).    [provider]  venlafaxine XR (EFFEXOR-XR) 150 MG 24 hr capsule Take 150 mg by mouth daily. 07/07/19   [provider]  venlafaxine XR (EFFEXOR-XR) 75 MG 24 hr capsule Take 75 mg by mouth daily. 06/07/20   [provider]    Allergies    Ace inhibitors, Lipitor [atorvastatin], Pravastatin sodium, and Welchol [colesevelam hcl]  Review of Systems   Review of Systems  Cardiovascular:  Positive for chest pain.  All other systems reviewed and are negative.  Physical Exam Updated Vital Signs BP (!) 146/93   Pulse 72   Temp 98.3 F (36.8 C) (Oral)   Resp 16   Ht 1.829 m (6')   Wt 93 kg   SpO2 97%   BMI 27.80 kg/m   Physical Exam Vitals and nursing note reviewed.  Constitutional:      General: He is not in acute distress.    Appearance: He is well-developed.  HENT:     Head: Normocephalic and atraumatic.     Right Ear: External ear normal.     Left Ear: External ear normal.  Eyes:     General: No scleral icterus.       Right eye: No discharge.        Left eye: No discharge.     Conjunctiva/sclera: Conjunctivae normal.  Neck:     Trachea: No tracheal deviation.  Cardiovascular:     Rate and Rhythm: Normal rate and regular rhythm.  Pulmonary:     Effort: Pulmonary effort is normal. No respiratory distress.     Breath sounds: Normal breath sounds. No stridor. No wheezing or rales.  Abdominal:     General: Bowel sounds are normal. There is no distension.     Palpations:  Abdomen is soft.     Tenderness: There is no abdominal tenderness. There is no guarding or rebound.  Musculoskeletal:        General: No tenderness or deformity.     Cervical back: Neck supple.  Skin:    General: Skin is warm and dry.     Findings: No rash.  Neurological:     General: No focal deficit present.     Mental Status: He is alert.     Cranial Nerves: No cranial nerve deficit (no facial droop, extraocular movements  intact, no slurred speech).     Sensory: No sensory deficit.     Motor: No abnormal muscle tone or seizure activity.     Coordination: Coordination normal.  Psychiatric:        Mood and Affect: Mood normal.    ED Results / Procedures / Treatments   Labs (all labs ordered are listed, but only abnormal results are displayed) Labs Reviewed  COMPREHENSIVE METABOLIC PANEL - Abnormal; Notable for the following components:      Result Value   Glucose, Bld 105 (*)    ALT 48 (*)    All other components within normal limits  CBC WITH DIFFERENTIAL/PLATELET  D-DIMER, QUANTITATIVE  TROPONIN I (HIGH SENSITIVITY)  TROPONIN I (HIGH SENSITIVITY)    EKG EKG Interpretation  Date/Time:  Monday February 17 2021 05:10:17 EST Ventricular Rate:  66 PR Interval:  182 QRS Duration: 92 QT Interval:  376 QTC Calculation: 394 R Axis:   31 Text Interpretation: Normal sinus rhythm Normal ECG No old tracing to compare Confirmed by Dorie Rank [phone]) on 02/17/2021 11:46:24 AM  Radiology DG Chest 2 View  Result Date: 02/17/2021 CLINICAL DATA:  Chest pain. EXAM: CHEST - 2 VIEW COMPARISON:  05/22/2018 FINDINGS: The lungs are clear without focal pneumonia, edema, pneumothorax or pleural effusion. The cardiopericardial silhouette is within normal limits for size. The visualized bony structures of the thorax show no acute abnormality. IMPRESSION: No active cardiopulmonary disease. Electronically Signed   By: Misty Stanley M.D.   On: 02/17/2021 05:38    Procedures Procedures    Medications Ordered in ED Medications - No data to display  ED Course  I have reviewed the triage vital signs and the nursing notes.  Pertinent labs & imaging results that were available during my care of the patient were reviewed by me and considered in my medical decision making (see chart for details).  Clinical Course as of 02/17/21 1455  Mon Feb 17, 2021  1445 D-dimer normal.  Troponin normal [JK]    Clinical Course User Index [JK] Dorie Rank, MD   MDM Rules/Calculators/A&P HEAR Score: 2                         Patient presented with complaints of sharp pinching pain in the left side of his chest.  Overall low risk heart score.  Considered pulmonary embolism acute coronary syndrome, pericarditis, pneumonia, pneumothorax.  ED work-up reassuring.  No signs of cardiac ischemia.  D-dimer negative.  Doubt PE.  Chest x-ray without pneumonia or pneumothorax.  Etiology of symptoms unclear but at this time no signs of worrisome etiology.  We will try course of NSAIDs.  Recommend close follow-up with PCP.  Consider echocardiogram or stress test if symptoms persist.  Also noted to have mild hypertension while he was here.  Discussed this with the patient and he will follow-up with his primary care doctor to have that rechecked. Final Clinical Impression(s) / ED Diagnoses Final diagnoses:  Chest pain, unspecified type  Hypertension, unspecified type    Rx / DC Orders ED Discharge Orders          Ordered    naproxen (NAPROSYN) 375 MG tablet  2 times daily        02/17/21 1453             Dorie Rank, MD 02/17/21 1455

## 2021-02-17 NOTE — ED Notes (Signed)
Pt provided discharge instructions and prescription information. Pt was given the opportunity to ask questions and questions were answered. Discharge signature not obtained in the setting of the COVID-19 pandemic in order to reduce high touch surfaces.  ° °

## 2021-02-17 NOTE — ED Triage Notes (Signed)
Pt c/o L sided CP since yesterday. No SOB, N/V, diaphoresis.

## 2021-02-17 NOTE — Discharge Instructions (Signed)
Take the medications as prescribed take the medications as prescribed to help with your pain.  Follow-up with your primary care doctor to recheck on your blood pressure and to discuss further evaluation.  Return as needed for worsening symptoms

## 2021-04-25 ENCOUNTER — Other Ambulatory Visit: Payer: Self-pay | Admitting: Otolaryngology

## 2021-04-25 DIAGNOSIS — H903 Sensorineural hearing loss, bilateral: Secondary | ICD-10-CM

## 2021-11-25 ENCOUNTER — Telehealth: Payer: Self-pay | Admitting: Internal Medicine

## 2021-11-25 NOTE — Telephone Encounter (Signed)
PA for repatha submitted via CMM (Key: [id number])

## 2021-11-26 NOTE — Telephone Encounter (Addendum)
Medication approved Effective from 11/25/2021 through 11/24/2022

## 2023-04-17 IMAGING — CR DG CHEST 2V
2 series · 2 of 2 positions shown · non-contrast
Comparison: 05/22/2018

CLINICAL DATA: Chest pain.

EXAM:
CHEST - 2 VIEW

[chest pa]
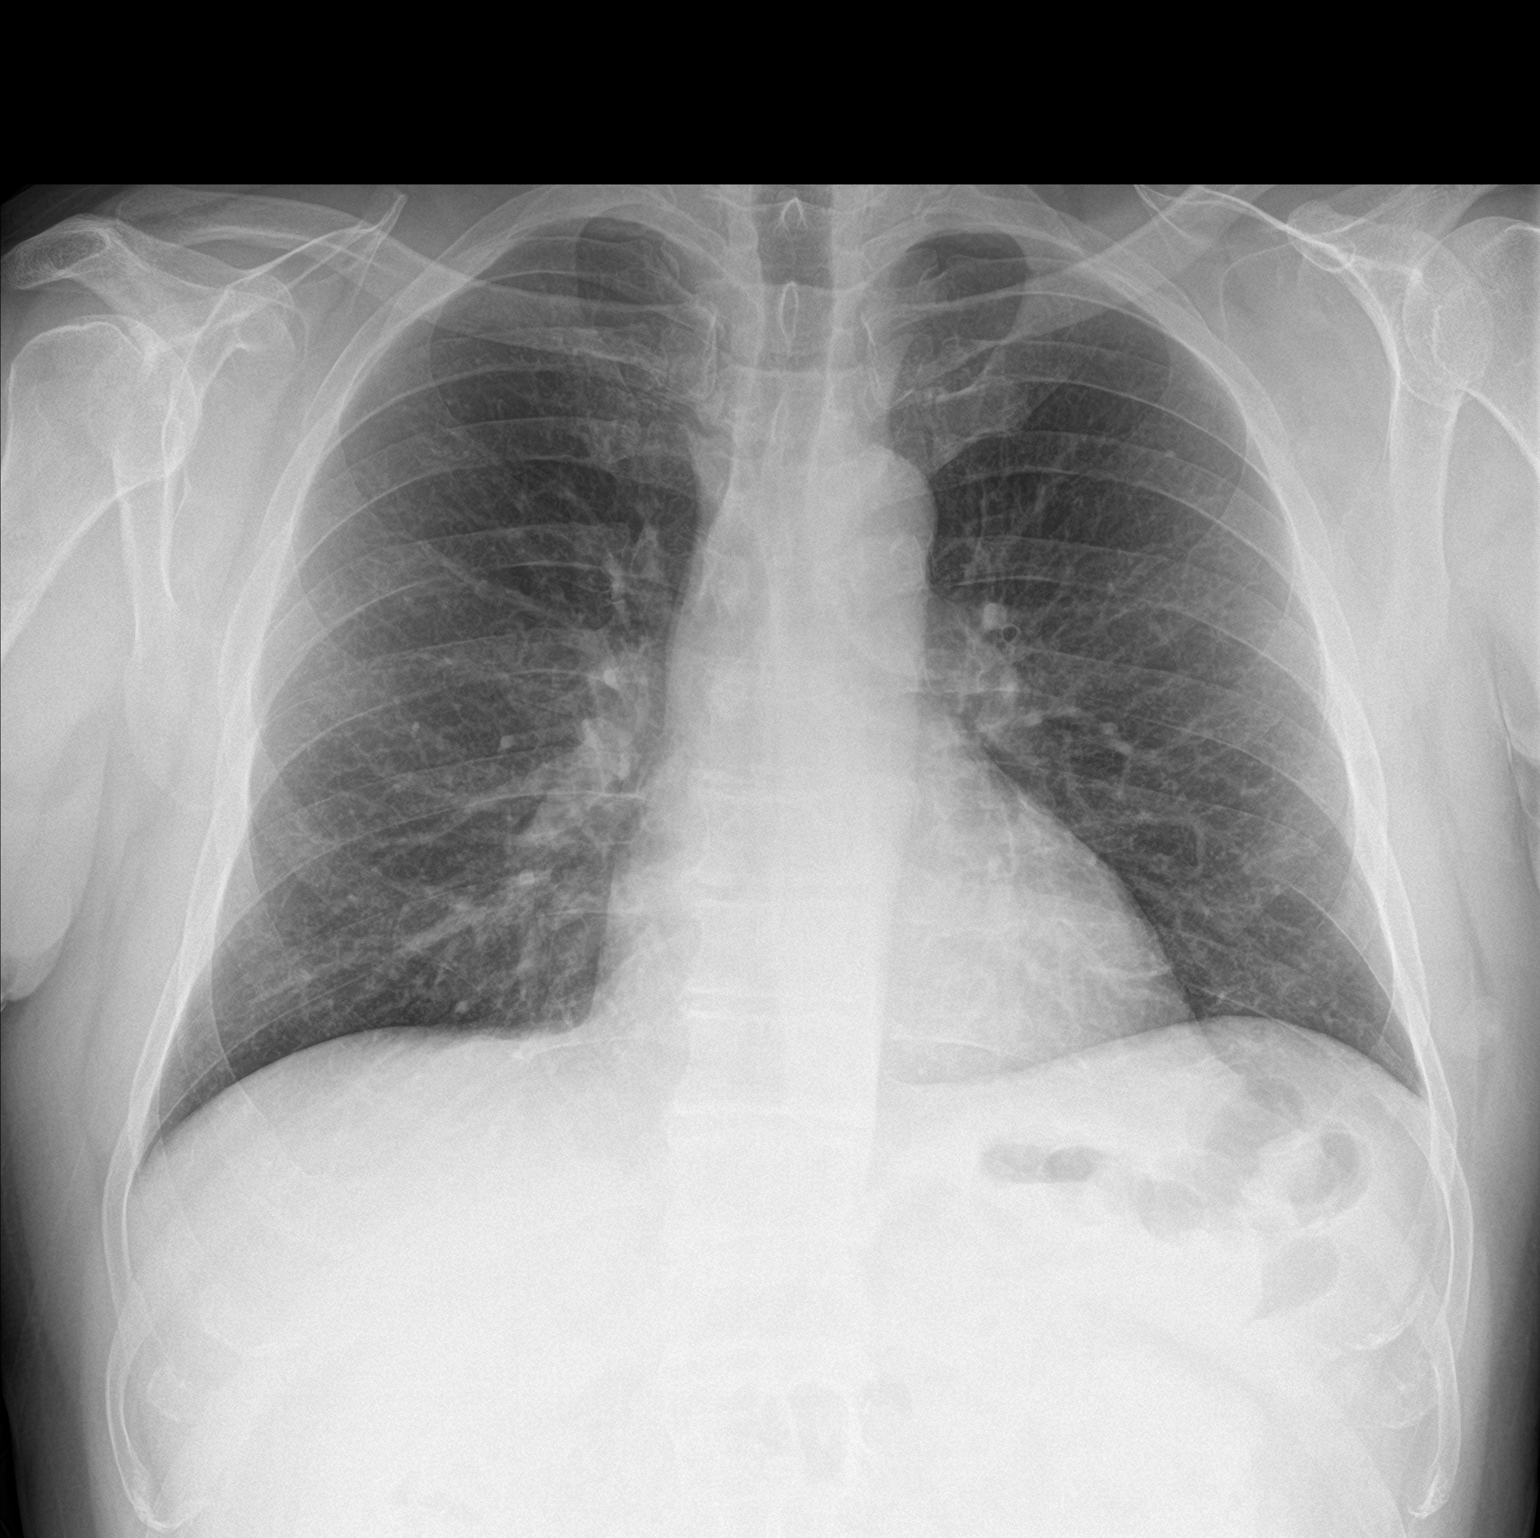

[chest lat]
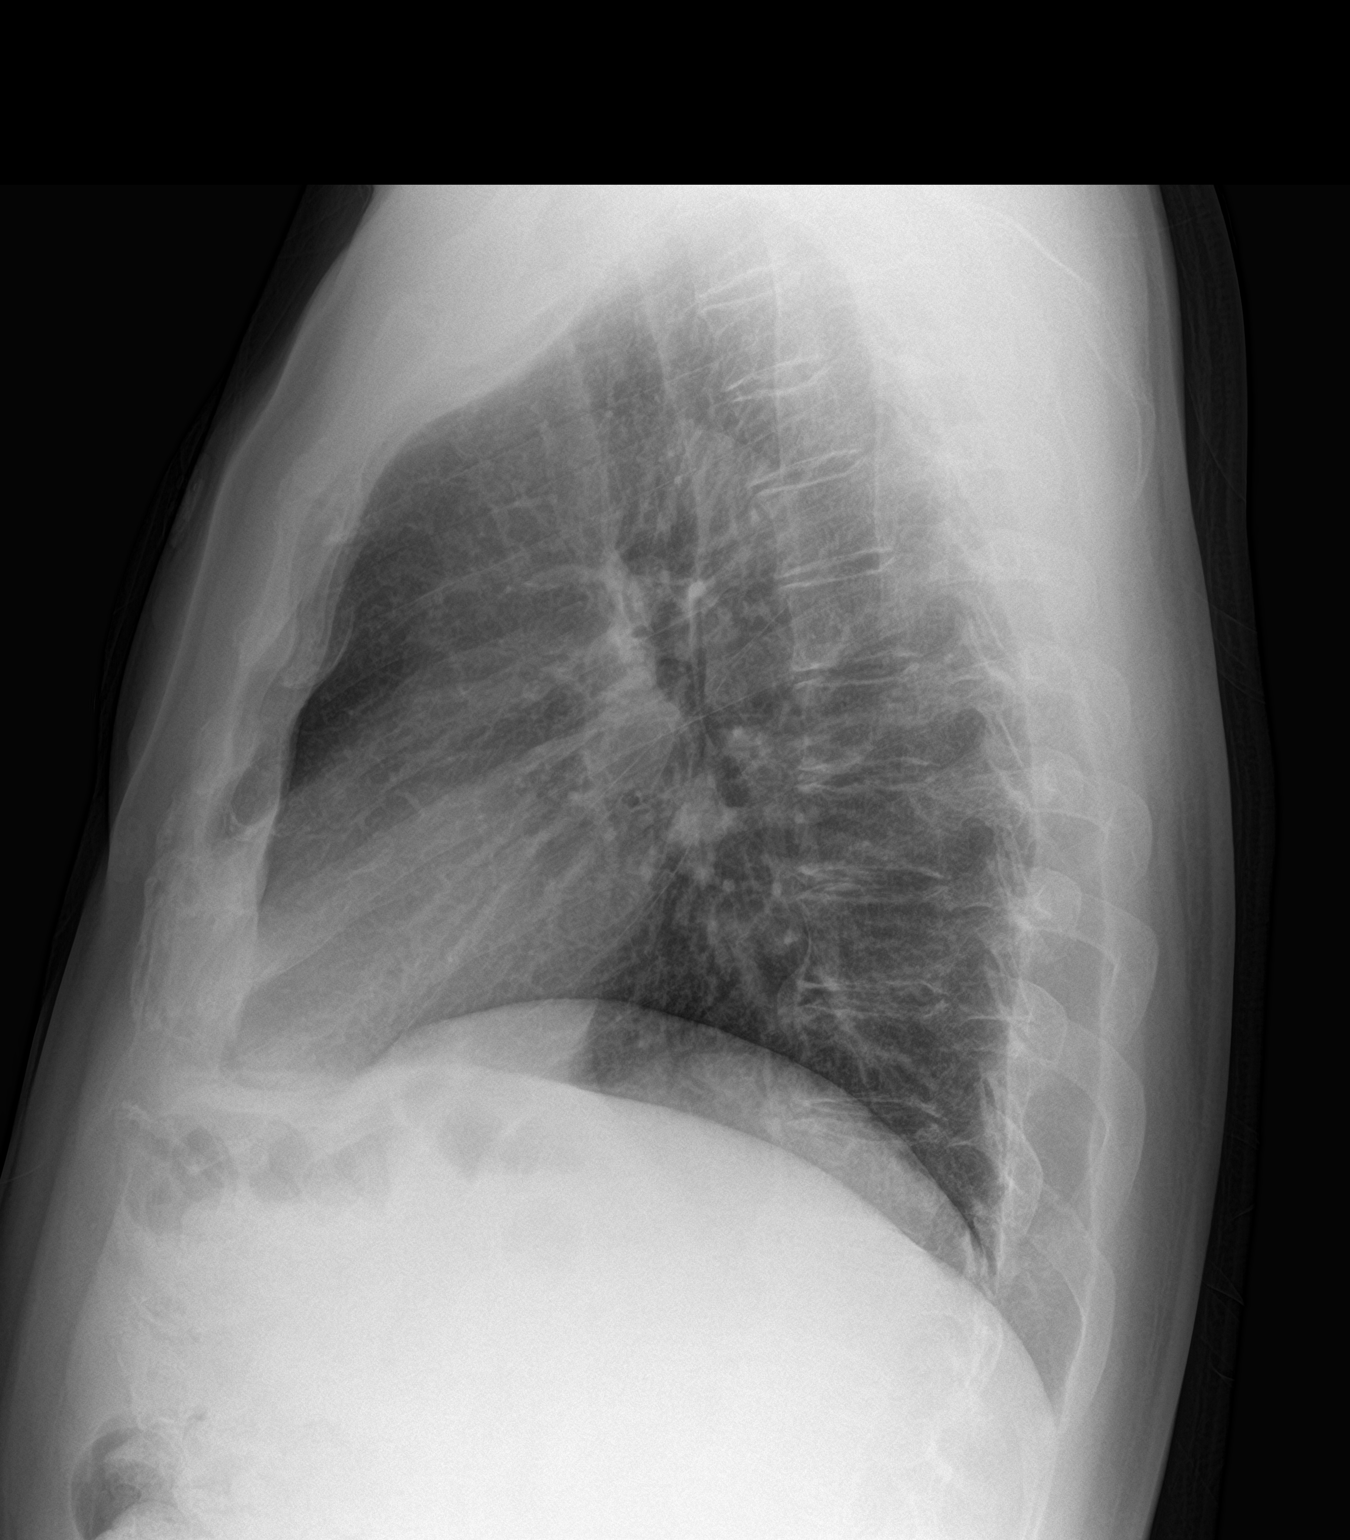

[2 of 2 positions shown; findings below may reference images not displayed]

FINDINGS: The lungs are clear without focal pneumonia, edema, pneumothorax or
pleural effusion. The cardiopericardial silhouette is within normal
limits for size. The visualized bony structures of the thorax show
no acute abnormality.
IMPRESSION: No active cardiopulmonary disease.
